# Patient Record
Sex: Female | Born: 1945
Health system: Southern US, Community
[De-identification: ages and names within clinical notes are randomized; demographics above are authoritative.]

## PROBLEM LIST (undated history)

## (undated) DIAGNOSIS — R112 Nausea with vomiting, unspecified: Secondary | ICD-10-CM

## (undated) DIAGNOSIS — E785 Hyperlipidemia, unspecified: Secondary | ICD-10-CM

## (undated) DIAGNOSIS — Z9889 Other specified postprocedural states: Secondary | ICD-10-CM

## (undated) DIAGNOSIS — K589 Irritable bowel syndrome without diarrhea: Secondary | ICD-10-CM

## (undated) DIAGNOSIS — F419 Anxiety disorder, unspecified: Secondary | ICD-10-CM

## (undated) DIAGNOSIS — I10 Essential (primary) hypertension: Secondary | ICD-10-CM

## (undated) DIAGNOSIS — Z780 Asymptomatic menopausal state: Secondary | ICD-10-CM

## (undated) DIAGNOSIS — M1990 Unspecified osteoarthritis, unspecified site: Secondary | ICD-10-CM

## (undated) DIAGNOSIS — F329 Major depressive disorder, single episode, unspecified: Secondary | ICD-10-CM

## (undated) DIAGNOSIS — F32A Depression, unspecified: Secondary | ICD-10-CM

## (undated) HISTORY — PX: COLONOSCOPY: SHX174

## (undated) HISTORY — DX: Hyperlipidemia, unspecified: E78.5

## (undated) HISTORY — DX: Essential (primary) hypertension: I10

## (undated) HISTORY — PX: REPLACEMENT TOTAL KNEE: SUR1224

## (undated) HISTORY — DX: Depression, unspecified: F32.A

## (undated) HISTORY — PX: TONSILLECTOMY: SUR1361

## (undated) HISTORY — DX: Irritable bowel syndrome, unspecified: K58.9

## (undated) HISTORY — PX: BREAST ENHANCEMENT SURGERY: SHX7

## (undated) HISTORY — DX: Unspecified osteoarthritis, unspecified site: M19.90

## (undated) HISTORY — DX: Major depressive disorder, single episode, unspecified: F32.9

## (undated) HISTORY — DX: Asymptomatic menopausal state: Z78.0

## (undated) HISTORY — DX: Anxiety disorder, unspecified: F41.9

---

## 1998-08-27 ENCOUNTER — Other Ambulatory Visit: Admission: RE | Admit: 1998-08-27 | Discharge: 1998-08-27 | Payer: Self-pay | Admitting: Family Medicine

## 1998-12-02 ENCOUNTER — Ambulatory Visit (HOSPITAL_COMMUNITY): Admission: RE | Admit: 1998-12-02 | Discharge: 1998-12-02 | Payer: Self-pay | Admitting: Gastroenterology

## 1999-11-17 ENCOUNTER — Other Ambulatory Visit: Admission: RE | Admit: 1999-11-17 | Discharge: 1999-11-17 | Payer: Self-pay | Admitting: Family Medicine

## 2000-11-29 ENCOUNTER — Other Ambulatory Visit: Admission: RE | Admit: 2000-11-29 | Discharge: 2000-11-29 | Payer: Self-pay | Admitting: Family Medicine

## 2002-01-02 ENCOUNTER — Other Ambulatory Visit: Admission: RE | Admit: 2002-01-02 | Discharge: 2002-01-02 | Payer: Self-pay | Admitting: Family Medicine

## 2002-07-12 ENCOUNTER — Encounter: Payer: Self-pay | Admitting: Emergency Medicine

## 2002-07-12 ENCOUNTER — Emergency Department (HOSPITAL_COMMUNITY): Admission: EM | Admit: 2002-07-12 | Discharge: 2002-07-12 | Payer: Self-pay | Admitting: Emergency Medicine

## 2003-03-08 ENCOUNTER — Other Ambulatory Visit: Admission: RE | Admit: 2003-03-08 | Discharge: 2003-03-08 | Payer: Self-pay | Admitting: Family Medicine

## 2004-05-19 ENCOUNTER — Other Ambulatory Visit: Admission: RE | Admit: 2004-05-19 | Discharge: 2004-05-19 | Payer: Self-pay | Admitting: Family Medicine

## 2005-07-27 ENCOUNTER — Other Ambulatory Visit: Admission: RE | Admit: 2005-07-27 | Discharge: 2005-07-27 | Payer: Self-pay | Admitting: Family Medicine

## 2006-01-24 ENCOUNTER — Inpatient Hospital Stay (HOSPITAL_COMMUNITY): Admission: RE | Admit: 2006-01-24 | Discharge: 2006-01-27 | Payer: Self-pay | Admitting: Orthopedic Surgery

## 2006-09-30 ENCOUNTER — Encounter: Admission: RE | Admit: 2006-09-30 | Discharge: 2006-09-30 | Payer: Self-pay | Admitting: Orthopedic Surgery

## 2007-06-13 ENCOUNTER — Other Ambulatory Visit: Admission: RE | Admit: 2007-06-13 | Discharge: 2007-06-13 | Payer: Self-pay | Admitting: Family Medicine

## 2010-10-13 ENCOUNTER — Encounter: Payer: Self-pay | Admitting: Internal Medicine

## 2010-10-13 ENCOUNTER — Ambulatory Visit (AMBULATORY_SURGERY_CENTER): Payer: 59

## 2010-10-13 VITALS — Ht 65.5 in | Wt 242.5 lb

## 2010-10-13 DIAGNOSIS — Z800 Family history of malignant neoplasm of digestive organs: Secondary | ICD-10-CM

## 2010-10-13 MED ORDER — PEG-KCL-NACL-NASULF-NA ASC-C 100 G PO SOLR: 1.0000 | Freq: Once | ORAL | Status: AC

## 2010-10-23 ENCOUNTER — Other Ambulatory Visit: Payer: Self-pay | Admitting: Internal Medicine

## 2010-10-23 NOTE — Op Note (Signed)
Brenda Reeves, Brenda Reeves            ACCOUNT NO.:  [id number]   MEDICAL RECORD NO.:  [id number]          PATIENT TYPE:  INP   LOCATION:  0005                         FACILITY:  [facility]   PHYSICIAN:  Ollen Gross, M.D.    DATE OF BIRTH:  [birth year]   DATE OF PROCEDURE:  01/24/2006  DATE OF DISCHARGE:                                 OPERATIVE REPORT   PREOPERATIVE DIAGNOSIS:  Osteoarthritis left knee.   POSTOPERATIVE DIAGNOSIS:  Osteoarthritis left knee.   PROCEDURE:  Left total knee arthroplasty.   SURGEON:  Ollen Gross, M.D.   ASSISTANT:  Avel Peace, PA.   ANESTHESIA:  Spinal.   ESTIMATED BLOOD LOSS:  Minimal.   DRAIN:  Hemovac x 1.   TOURNIQUET TIME:  39 minutes.   COMPLICATIONS:  None.   CONDITION:  Stable to recovery.   CLINICAL NOTE:  Brenda Reeves is a 65 year old female with end-stage  osteoarthritis of the left knee with intractable pain.  She presents now for  left total knee arthroplasty.  In addition, she has significant  retrocalcaneal bursitis and Achilles tendinitis of the right heel and I am  going to plan on injecting that today.   PROCEDURE IN DETAIL:  After successful administration of spinal anesthetic,  a tourniquet was placed high on the left thigh and left lower extremity  prepped and draped in the usual sterile fashion.  Extremity had been marked,  knee flexed, tourniquet inflated to 300 mmHg.  Midline incision was made  with a #10 blade through subcutaneous tissue to the level of the extensor  mechanism.  Fresh blade was used to make a medial parapatellar arthrotomy in  the soft tissue of the proximal medial tibia, subperiosteally elevated to  the joint line with a knife into the semimembranosus bursa with a Cobb  elevator.  Soft tissue of the proximal lateral tibia was also elevated with  attention being paid to the avoid the patellar tendon on the tibial  tubercle.  The patella was everted, the knee flexed to 90 degrees, and ACL  and PCL  removed.  Drill was used to create a starting hole in the distal  femur.  The canal was thoroughly irrigated.  A 5-degree left valgus  alignment guide was placed.  The posterior condyle rotation was marked and a  block pin to remove 10 mm off the distal femur.  Distal femoral resection  was made with an oscillating saw.  Cutting blocks placed and size 3 was the  most appropriate.  Rotatoin was marked at the epicondylar axis.  Size 3  cutting block was placed and the anterior, posterior, and chamfer cuts were  made.   Tibia subluxed forward and the menisci removed.  Extramedullary tibial  alignment guide was placed surfacing proximally at the medial aspect of the  tibial tubercle and distally along the second metatarsal axis and tibial  crest.  Blocks pinned to remove approximately 10 mm off the nondeficient  lateral side.  To get down to the base of the defect medially, we had to  take an additional 2.  The size 2.5 tibia was the most appropriate  and then  the proximal tibia was repaired with a modular drill and keyhole punch 4,  size 2.5.  Femoral preparation was completed with the intercondylar cuff  with a size 3.   Size 2.5 mobile bearing tibial trial, size 3 posterior stabilized femoral  trial, and a 12.5-mm posterior stabilized rotating flap from insert trial  placed.  With the 12.5, full extension was achieved with excellent varus and  valgus balance throughout full range of motion.  Patella was everted,  measured to be 22 mm.  Freehand resection was taken to 13 mm.  A 35 template  was placed, low holes drilled, trial patella was placed and it tracks  normally.  Osteophytes were then removed off the posterior femur with the  trials in place.  All trials were removed and the cut bone surfaces prepared  with pulsatile lavage.  Cement was mixed and once with reimplantation, the  size 2.5 mobile bearing tibial tray size 3, posterior stabilized femur and  35 patella cemented into  place, patella was held with a clamp.  Trial 12.5  insert was placed in full extension.  All extrinsic cement removed.  Once  the cement was full hardened, the permanent 12.5-mm posterior stabilized  rotating platform insert was placed into the tibial tray.  The wound was  copiously irrigated with saline solution and the extensor mechanism closed  over a Hemovac drain with interrupted #1 PDS.  Tourniquet was released with  a total time of 39 minutes.  Flexoin against gravity was about 130 degrees.  Subcu was closed with interrupted 2-0 Vicryl, subcuticular running 4-0  Monocryl.  Incision was cleaned and dried and Steri-Strips and a bulky  sterile dressing applied.  Drain was hooked to suction and she was placed  into a knee immobilizer.  I then adequately prepped the right heel.  I  injected the retrocalcaneal bursa with 1 cc of lidocaine and 1 cc of Depo-  Medrol with no problems.  A band-aid was placed.  She was subsequently  awakened and transported to recovery in stable condition.      Ollen Gross, M.D.  Electronically Signed     FA/MEDQ  D:  01/24/2006  T:  01/24/2006  Job:  [id number]

## 2010-10-23 NOTE — H&P (Signed)
Brenda Reeves, Brenda Reeves            ACCOUNT NO.:  [id number]   MEDICAL RECORD NO.:  [id number]          PATIENT TYPE:  INP   LOCATION:  NA                           FACILITY:  [facility]   PHYSICIAN:  Ollen Gross, M.D.    DATE OF BIRTH:  24-Jul-1945   DATE OF ADMISSION:  01/24/2006  DATE OF DISCHARGE:                                HISTORY & PHYSICAL   DATE OF OFFICE VISIT HISTORY AND PHYSICAL:  January 07, 2006.   CHIEF COMPLAINT:  Bilateral knee pain, left greater than right.   HISTORY OF PRESENT ILLNESS:  The patient is a 65 year old female who has  been seen by Dr. Lequita Halt for ongoing bilateral knee pain; the left has been  more symptomatic than the right for some time now.  She has been treated  conservatively in the past including injections, and also  viscosupplementation.  Unfortunately, they have not been helpful.  She is  actually at the stage now where the pain has been interfering with her.  She  would like to have something more permanent done.  She has been seen by Dr.  Lequita Halt.  X-rays show end-stage arthritis in both knees.  She is at a point  now where she would benefit from undergoing knee replacement.  The left is  more symptomatic than the right.  Risks and benefits have been discussed,  and she elects to proceed with surgery.   ALLERGIES:  SULFA causes rash and hives.   CURRENT MEDICATIONS:  1. TOPROL-XL 50 mg.  2. Cymbalta 60 mg  3. __________ XL 300 mg.  4. Lipitor 20 mg.   PAST MEDICAL HISTORY:  1. Depression.  2. Hypercholesterolemia.  3. Hypertension.  4. History of renal calculi.  5. Arthritis.  6. Postmenopausal.   PAST SURGICAL HISTORY:  She has had 1 cesarean section and also breast  implants.   SOCIAL HISTORY:  Married.  Works as a Network engineer.  Nonsmoker.  No  alcohol.  Two children.   FAMILY HISTORY:  Father and brother with history of heart disease.  Mother  with history of liver cancer.   REVIEW OF SYSTEMS:  GENERAL:  No fevers,  chills or night sweats.  NEUROLOGIC:  No seizures, syncope or paralysis.  RESPIRATORY:  No shortness  of breath, productive cough or hemoptysis.  CARDIOVASCULAR:  No chest pain,  angina, orthopnea.  GI:  No nausea, vomiting, diarrhea or constipation.  GU:  History of renal calculi.  No dysuria, hematuria or discharge.  MUSCULOSKELETAL:  Both knees.   PHYSICAL EXAMINATION:  VITAL SIGNS:  Pulse 66, respirations 12, blood  pressure 124/78.  GENERAL:  A 65 year old white female, well-nourished, well-developed, in no  acute distress.  Overweight.  Alert, oriented, cooperative, pleasant.  Excellent historian.  HEENT:  Normocephalic and atraumatic.  Pupils round and reactive.  Does not  wear glasses.  Extraocular movements intact.  NECK:  Supple.  CHEST:  Clear, anterior and posterior chest walls.  HEART:  Regular rate and rhythm without murmur.  S1 and S2 noted.  ABDOMEN:  Soft, nontender.  Bowel sounds present.  RECTAL/BREASTS/GENITALIA:  Not done.  Not pertinent to present illness.  EXTREMITIES:  Left knee shows slight varus malalignment.  Marked crepitus is  noted, 5 to 120 on passive range of motion, tender more medial than lateral.  Right knee shows minimal deformity, range of motion of 5 to 120 degrees on  passive range of motion, marked crepitus, more tender medial than lateral.   IMPRESSION:  1. Osteoarthritis, bilateral knees, left more than symptomatic than right.  2. Depression.  3. Hypercholesterolemia.  4. Hypertension.  5. History of renal calculi.  6. Arthritis.  7. Postmenopausal.   PLAN:  The patient admitted to [facility] to undergo a left total  knee replacement arthroplasty.  The surgery will be performed by Dr. Ollen Gross.      Alexzandrew L. Julien Girt, P.A.      Ollen Gross, M.D.  Electronically Signed    ALP/MEDQ  D:  01/23/2006  T:  01/23/2006  Job:  [id number]   cc:   Otilio Connors. Gerri Spore, M.D.  Fax: [phone]

## 2010-10-23 NOTE — Discharge Summary (Signed)
Brenda Reeves, Brenda Reeves            ACCOUNT NO.:  [id number]   MEDICAL RECORD NO.:  [id number]          PATIENT TYPE:  INP   LOCATION:  1517                         FACILITY:  [facility]   PHYSICIAN:  Ollen Gross, M.D.    DATE OF BIRTH:  07/22/1945   DATE OF ADMISSION:  01/24/2006  DATE OF DISCHARGE:  01/27/2006                                 DISCHARGE SUMMARY   ADMISSION DIAGNOSES:  1. Osteoarthritis bilateral knees, left more symptomatic than right.  2. Depression.  3. Hypercholesterolemia.  4. Hypertension.  5. History of renal calculi.  6. Arthritis.  7. Postmenopausal.   DISCHARGE DIAGNOSES:  1. Osteoarthritis left knee, status post left total knee replacement      arthroplasty.  2. Osteoarthritis right knee.  3. Mild acute blood loss anemia, did not require transfusion.  4. Depression.  5. Hypercholesterolemia.  6. Hypertension.  7. History of renal calculi.  8. Arthritis.  9. Postmenopausal.   PROCEDURE:  January 24, 2006, left total knee.   SURGEON:  Ollen Gross, M.D.   ASSISTANT:  Alexzandrew L. Perkins, P.A.-C.   CONSULTATIONS:  None.   BRIEF HISTORY:  Brenda Reeves is a 65 year old female with osteoarthritis of  both knees, left more problematic than the right, and now presents for total  knee arthroplasty.  She has also had some retrocalcaneal bursitis Achilles  tendinitis in the right heel and planned on injection.   LABORATORY DATA:  Preoperative CBC with hemoglobin 13.7, hematocrit 40.4,  white cell count 7.4.  Postoperative hemoglobin 10.9, drifted down to 10.5,  last known H&H back up to 10.8 and 30.5.  PT and PTT preoperatively 12.4 and  27, respectively.  INR 0.9.  Serial protimes followed.  Last noted PT/INR  34.8 and 3.2.  Chem-panel on admission all within normal limits with the  except of ALP which was slightly elevated to 145.  Serial BMETs were  followed.  Electrolytes remained within normal limits.  Preoperative UA  positive nitrites, small  leukocyte esterase, 7-10 white cells, rare  epithelium, many bacteria, treated preoperatively.  Blood group type A  positive.   Chest two-view January 17, 2006, no evidence of active chest disease.   EKG in 2006, (not able to read the date, looks like April 2006) sinus  tachycardia, otherwise normal.  Confirmed.  Unable to read signature.   HOSPITAL COURSE:  Patient admitted to [facility]  Tolerated the  procedure well.  Later transferred to the recovery room and started on p.o.  and IV analgesics for pain control following surgery.  We in a lot of pain  on the evening of surgery.  Had been placed on PCA.  Had to discontinue this  because of drowsiness.  Encouraged the pills.  Had IV push backup.  Continued with pulse oximeter.  Pulse oximeter ranged from 89 to about 93 on  the morning of day #1.  Encouraged pulmonary toilet.  Hemoglobin was stable  at 10.9, started getting out of bed.  Had a little bit of sats that were  going down a little bit further down than high 80s and low 90s.  Encouraged  incentive spirometer.   By day #2, was doing much better.  Had some discomfort but pain was under  better control.  Had already been up in the chair.  Dressing changed.  Incision looked good. Potassium had dropped a little bit down to 3.5 but  still was within normal limits but we gave her one dose to pump it back up.  Probably likely due to dilutional.   From the therapy standpoint, she started to progress, started getting up and  ambulating approximately 50 feet on day #2 and then again by day #3.  By day  #3, she was doing well, tolerating her medications, eating and drinking okay  and then was ready to go home.   DISCHARGE PLAN:  Patient discharged home on January 27, 2006.   DISCHARGE DIAGNOSES:  Please see above.   DISCHARGE MEDICATIONS:  1. Coumadin.  2. Percocet.  3. Robaxin.   DIET:  Low sodium, low cholesterol diet.   ACTIVITY:  Weightbearing as tolerated left  lower extremity.  Home health PT,  home health nursing, total knee protocol.  PT for gait training and  ambulation.  Home nurse for Coumadin and protimes.   FOLLOW UP:  Two weeks.   DISPOSITION:  Home.   CONDITION ON DISCHARGE:  Improved.      Alexzandrew L. Julien Girt, P.A.      Ollen Gross, M.D.  Electronically Signed    ALP/MEDQ  D:  02/11/2006  T:  02/11/2006  Job:  [id number]   cc:   Otilio Connors. Gerri Spore, M.D.  Fax: [phone]

## 2010-11-18 ENCOUNTER — Other Ambulatory Visit: Payer: Self-pay | Admitting: Internal Medicine

## 2011-01-21 ENCOUNTER — Encounter: Payer: Self-pay | Admitting: Internal Medicine

## 2011-01-21 ENCOUNTER — Ambulatory Visit (INDEPENDENT_AMBULATORY_CARE_PROVIDER_SITE_OTHER): Payer: 59 | Admitting: Internal Medicine

## 2011-01-21 DIAGNOSIS — E669 Obesity, unspecified: Secondary | ICD-10-CM

## 2011-01-21 DIAGNOSIS — I10 Essential (primary) hypertension: Secondary | ICD-10-CM

## 2011-01-21 DIAGNOSIS — F32A Depression, unspecified: Secondary | ICD-10-CM

## 2011-01-21 DIAGNOSIS — E785 Hyperlipidemia, unspecified: Secondary | ICD-10-CM

## 2011-01-21 DIAGNOSIS — K649 Unspecified hemorrhoids: Secondary | ICD-10-CM | POA: Insufficient documentation

## 2011-01-21 DIAGNOSIS — K589 Irritable bowel syndrome without diarrhea: Secondary | ICD-10-CM | POA: Insufficient documentation

## 2011-01-21 DIAGNOSIS — F329 Major depressive disorder, single episode, unspecified: Secondary | ICD-10-CM

## 2011-01-21 DIAGNOSIS — E559 Vitamin D deficiency, unspecified: Secondary | ICD-10-CM | POA: Insufficient documentation

## 2011-01-21 DIAGNOSIS — F419 Anxiety disorder, unspecified: Secondary | ICD-10-CM | POA: Insufficient documentation

## 2011-01-21 DIAGNOSIS — Z780 Asymptomatic menopausal state: Secondary | ICD-10-CM | POA: Insufficient documentation

## 2011-01-21 DIAGNOSIS — M1990 Unspecified osteoarthritis, unspecified site: Secondary | ICD-10-CM | POA: Insufficient documentation

## 2011-01-21 LAB — COMPREHENSIVE METABOLIC PANEL
ALT: 12 U/L (ref 0–35)
AST: 12 U/L (ref 0–37)
Albumin: 4 g/dL (ref 3.5–5.2)
Alkaline Phosphatase: 103 U/L (ref 39–117)
BUN: 17 mg/dL (ref 6–23)
CO2: 22 mEq/L (ref 19–32)
Calcium: 9.3 mg/dL (ref 8.4–10.5)
Chloride: 107 mEq/L (ref 96–112)
Creat: 0.82 mg/dL (ref 0.50–1.10)
Glucose, Bld: 97 mg/dL (ref 70–99)
Potassium: 4.3 mEq/L (ref 3.5–5.3)
Sodium: 139 mEq/L (ref 135–145)
Total Bilirubin: 0.7 mg/dL (ref 0.3–1.2)
Total Protein: 6.9 g/dL (ref 6.0–8.3)

## 2011-01-21 LAB — CBC WITH DIFFERENTIAL/PLATELET
Basophils Absolute: 0 10*3/uL (ref 0.0–0.1)
Basophils Relative: 1 % (ref 0–1)
Eosinophils Absolute: 0.2 10*3/uL (ref 0.0–0.7)
Eosinophils Relative: 2 % (ref 0–5)
HCT: 42.8 % (ref 36.0–46.0)
Hemoglobin: 13.7 g/dL (ref 12.0–15.0)
Lymphocytes Relative: 23 % (ref 12–46)
Lymphs Abs: 1.8 10*3/uL (ref 0.7–4.0)
MCH: 27.7 pg (ref 26.0–34.0)
MCHC: 32 g/dL (ref 30.0–36.0)
MCV: 86.5 fL (ref 78.0–100.0)
Monocytes Absolute: 0.7 10*3/uL (ref 0.1–1.0)
Monocytes Relative: 9 % (ref 3–12)
Neutro Abs: 5.3 10*3/uL (ref 1.7–7.7)
Neutrophils Relative %: 66 % (ref 43–77)
Platelets: 326 10*3/uL (ref 150–400)
RBC: 4.95 MIL/uL (ref 3.87–5.11)
RDW: 13.8 % (ref 11.5–15.5)
WBC: 8 10*3/uL (ref 4.0–10.5)

## 2011-01-21 LAB — LIPID PANEL
Cholesterol: 223 mg/dL — ABNORMAL HIGH (ref 0–200)
HDL: 52 mg/dL (ref >39)
LDL Cholesterol: 153 mg/dL — ABNORMAL HIGH (ref 0–99)
Total CHOL/HDL Ratio: 4.3 Ratio
Triglycerides: 90 mg/dL (ref <150)
VLDL: 18 mg/dL (ref 0–40)

## 2011-01-21 LAB — TSH: TSH: 1.965 u[IU]/mL (ref 0.350–4.500)

## 2011-01-21 NOTE — Progress Notes (Signed)
Subjective:    Patient ID: Brenda Reeves, female    DOB: May 08, 1946, 65 y.o.   MRN: [id number]  HPI  Brenda Reeves is a very pleasant female who is here for her first visit.  Former care Dr. Clyde Canterbury.  PMH of HTN, Hyperlipidemia, chronic depression on Prozac for the last 15 years, DJD, Obesity,  and vitamin D deficiency.  She tells me she is intolerant of statins.  She has been taking Vit D 50,000 units for about 8 weeks now.  She has multiple concerns but primarily states she knows her depression prevents her from doing many things like exercising and eating right.  She has been on the same dose of Prozac for many years, has never seen a therapist,  And has never been hospitalizded for her depression.  She needs her vitamin D and multiple labs checked.    She states she does not like to take Metoprolol  Because she thinks it makes her feel bad.  She still enjoys being around her dog, she is eating excessively,  She is not motivated except for when she is at work, otherwise "I just sit on the couch"  No suicidal or homicidal ideation.  No psychotic features.  She also reports approx. 3 months ago she had vaginal spotting for a few days but has had none since.  She does not have a GYN  Allergies  Allergen Reactions  . Sulfa Antibiotics Itching   Past Medical History  Diagnosis Date  . IBS (irritable colon syndrome)   . Hemorrhoids   . Menopause   . Anxiety   . Arthritis   . Depression     on Prozac for 15 years  . Hyperlipidemia     intolerant to statins  . Hypertension    Past Surgical History  Procedure Date  . Colonoscopy   . Cesarean section   . Breast enhancement surgery   . Replacement total knee     left knee  . Tonsillectomy    History   Social History  . Marital Status: Married    Spouse Name: N/A    Number of Children: 2  . Years of Education: N/A   Occupational History  . [employer]   Social History Main Topics  . Smoking  status: Never Smoker   . Smokeless tobacco: Never Used  . Alcohol Use: No  . Drug Use: No  . Sexually Active: Not Currently   Other Topics Concern  . Not on file   Social History Narrative  . No narrative on file   Family History  Problem Relation Age of Onset  . Colon cancer Mother   . Cancer Mother     liver  . Heart disease Mother     cardiomyopathy  . Heart disease Father     MI   Patient Active Problem List  Diagnoses  . Anxiety  . Arthritis  . IBS (irritable colon syndrome)  . Depression  . Hypertension  . Hyperlipidemia  . Hemorrhoids  . Menopause  . Vitamin D deficiency   Current Outpatient Prescriptions on File Prior to Visit  Medication Sig Dispense Refill  . fish oil-omega-3 fatty acids 1000 MG capsule Take by mouth daily.        Marland Kitchen FLUoxetine (PROZAC) 40 MG capsule Take 40 mg by mouth daily.        . metoprolol tartrate (LOPRESSOR) 25 MG tablet Take 25 mg by mouth daily.        Marland Kitchen  Multiple Vitamins-Minerals (MULTIVITAMIN,TX-MINERALS) tablet Take 1 tablet by mouth daily.                Review of Systems No chest pain, no SOB,  No LE Edema,  No GI symptoms    Objective:   Physical ExamPhysical Exam  Nursing note and vitals reviewed.  Constitutional: She is oriented to person, place, and time. She appears well-developed and well-nourished.  HENT:  Head: Normocephalic and atraumatic.  Cardiovascular: Normal rate and regular rhythm. Exam reveals no gallop and no friction rub.  No murmur heard.  Pulmonary/Chest: Breath sounds normal. She has no wheezes. She has no rales.  Neurological: She is alert and oriented to person, place, and time.  Skin: Skin is warm and dry.  Psychiatric: She has a normal mood and affect. Her behavior is normal.             Assessment & Plan:  1)  Depression:  Will discuss with Dr. Donell Beers regarding adding second agent.  I gave pt number to [facility] therapist and pt states she will call. 2)  Postmenopausal  spotting:  Will refer to Dr. Edward Jolly 3)  HTN:  Recheck next visit but may need to change meds.  Not at goal year.  Check chemistries today.   Free thyroid levels done last year normal 4)  Hyperlipidemia:  Will check labs today.  She reports she is intorlerant of statins 5)  Arthritis 6) Obesity.  I spent 45 minutes with this pt.  She is to schedule CPE

## 2011-01-21 NOTE — Progress Notes (Signed)
Appt scheduled with Dr Wyvonnia Lora 01/28/11 @ 950am.  Pt aware before she left office today.  Referral faxed to Dr. Anastasio Auerbach, rn

## 2011-01-21 NOTE — Patient Instructions (Signed)
Schedule CPE   Keep apppt with GYN Dr., Edward Jolly

## 2011-01-22 LAB — VITAMIN D 25 HYDROXY (VIT D DEFICIENCY, FRACTURES): Vit D, 25-Hydroxy: 30 ng/mL (ref 30–89)

## 2011-01-26 ENCOUNTER — Telehealth: Payer: Self-pay | Admitting: Internal Medicine

## 2011-01-26 ENCOUNTER — Other Ambulatory Visit: Payer: Self-pay | Admitting: Internal Medicine

## 2011-01-26 DIAGNOSIS — F32A Depression, unspecified: Secondary | ICD-10-CM

## 2011-01-26 DIAGNOSIS — F329 Major depressive disorder, single episode, unspecified: Secondary | ICD-10-CM

## 2011-01-26 MED ORDER — BUPROPION HCL ER (XL) 150 MG PO TB24: ORAL_TABLET | ORAL | Status: DC

## 2011-01-26 NOTE — Telephone Encounter (Signed)
Spoke with pt.  Will add Wellbutrin as second agent for depression.  No hx of seizure disorder.  Pt voices understanding .  Keep appt with me

## 2011-01-28 ENCOUNTER — Encounter: Payer: Self-pay | Admitting: Emergency Medicine

## 2011-01-28 NOTE — Telephone Encounter (Signed)
Med sent via eprescribe

## 2011-02-02 ENCOUNTER — Ambulatory Visit: Payer: 59 | Admitting: Internal Medicine

## 2011-02-03 ENCOUNTER — Telehealth: Payer: Self-pay | Admitting: Internal Medicine

## 2011-02-05 ENCOUNTER — Other Ambulatory Visit: Payer: Self-pay | Admitting: Obstetrics and Gynecology

## 2011-02-11 ENCOUNTER — Ambulatory Visit: Payer: 59 | Admitting: Internal Medicine

## 2011-02-12 ENCOUNTER — Encounter (HOSPITAL_COMMUNITY)
Admission: RE | Admit: 2011-02-12 | Discharge: 2011-02-12 | Disposition: A | Discharge status: Home / Self Care | Payer: 59 | Source: Ambulatory Visit | Attending: Obstetrics and Gynecology | Admitting: Obstetrics and Gynecology

## 2011-02-12 ENCOUNTER — Other Ambulatory Visit: Payer: Self-pay

## 2011-02-12 ENCOUNTER — Encounter (HOSPITAL_COMMUNITY): Payer: Self-pay

## 2011-02-12 HISTORY — DX: Other specified postprocedural states: R11.2

## 2011-02-12 HISTORY — DX: Other specified postprocedural states: Z98.890

## 2011-02-12 LAB — CBC
HCT: 39.9 % (ref 36.0–46.0)
Hemoglobin: 12.6 g/dL (ref 12.0–15.0)
MCH: 27.7 pg (ref 26.0–34.0)
MCHC: 31.6 g/dL (ref 30.0–36.0)
MCV: 87.7 fL (ref 78.0–100.0)
Platelets: 283 10*3/uL (ref 150–400)
RBC: 4.55 MIL/uL (ref 3.87–5.11)
RDW: 13.6 % (ref 11.5–15.5)
WBC: 7.5 10*3/uL (ref 4.0–10.5)

## 2011-02-12 NOTE — Patient Instructions (Addendum)
Brenda Reeves  02/12/2011   Your procedure is scheduled on:  02/16/11  Enter through the Main Entrance of [facility] at 930 AM.  Pick up the phone at the desk and dial [phone].   Call this number if you have problems the morning of surgery: [phone]   Remember:   Do not eat food:After Midnight.  Do not drink clear liquids: After Midnight.  Take these medicines the morning of surgery with A SIP OF WATER: Lopressor   Do not wear jewelry, make-up or nail polish.  Do not wear lotions, powders, or perfumes. You may wear deodorant.  Do not shave 48 hours prior to surgery.  Do not bring valuables to the hospital.  Contacts, dentures or bridgework may not be worn into surgery.  Leave suitcase in the car. After surgery it may be brought to your room.  For patients admitted to the hospital, checkout time is 11:00 AM the day of discharge.   Patients discharged the day of surgery will not be allowed to drive home.  Name and phone number of your driver: husband   [name]      Special Instructions: CHG Shower Use Special Wash: 1/2 bottle night before surgery and 1/2 bottle morning of surgery.   Please read over the following fact sheets that you were given: NA

## 2011-02-15 NOTE — H&P (Signed)
Brenda Reeves, BOTTOMS NO.:  [id number]  MEDICAL RECORD NO.:  [id number]  LOCATION:  SDC                           FACILITY:  [facility]  PHYSICIAN:  Randye Lobo, M.D.   DATE OF BIRTH:  1946-01-17  DATE OF ADMISSION:  02/12/2011 DATE OF DISCHARGE:  02/12/2011                             HISTORY & PHYSICAL   CHIEF COMPLAINT:  Vaginal bleeding.  HISTORY OF PRESENT ILLNESS:  The patient is a 65 year old gravida 3, para 2-0-1-2 Caucasian female who presented in August 2012 reporting vaginal bleeding 6 weeks prior.  The patient denies any symptoms of pain.  She underwent menopause in 2002 and has never taken any hormone replacement therapy.  Ultrasound performed in the office on January 28, 2011 documented an endometrial stripe of 20.63 mm with small cystic areas in the endometrium.  Neither ovary was seen.  The patient underwent an endometrial biopsy on January 28, 2011 and this documented atrophic appearing endometrium with no evidence of hyperplasia or malignancy.  PAST OBSTETRIC AND GYNECOLOGIC HISTORIES:  Status post cesarean section x1.  Status post vaginal delivery x1.  Status post spontaneous miscarriage x1.  The patient is status post bilateral tubal ligation with reversal.  The patient's last Pap smear was performed January 28, 2011 and was within normal limits.  Her last mammogram was performed in 2011 and was within normal limits.  PAST MEDICAL HISTORY: 1. History of irritable bowel syndrome. 2. History of urinary tract infections.  The patient is status post     treatment for urinary tract infection on January 28, 2011.  A     followup urine culture on February 04, 2011 was negative. 3. Hypertension 4. Depression.  PAST SURGICAL HISTORY: 1. Status post cesarean section x1. 2. Status post bilateral breast implants. 3. Status post bilateral tubal ligation. 4. Status post bilateral tubal ligation reversal. 5. Status post knee replacement.  MEDICATIONS:   Prozac, metoprolol, Macrobid.  ALLERGIES:  SULFA.  SOCIAL HISTORY:  The patient has been married for 29 years.  She has 2 children.  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  She denies the use of tobacco, alcohol, or illicit drugs.  FAMILY HISTORY:  Positive for coronary artery disease in the patient's mother and father.  Positive for colon cancer in the patient's mother. There is no family history of breast, uterine, or ovarian cancer.  REVIEW OF SYSTEMS:  Please refer to the history of present illness.  PHYSICAL EXAMINATION:  VITAL SIGNS:  Height 5 feet 5 inches, weight 246 pounds, blood pressure 130/76. LUNGS:  Clear to auscultation bilaterally. HEART:  S1, S2 with regular rate and rhythm. ABDOMEN:  There is evidence of an umbilical hernia which is nontender. ABDOMEN:  Soft and nontender and without evidence of hepatosplenomegaly or organomegaly. PELVIC:  Normal external genitalia and urethra.  The cervix and vagina demonstrate no lesions.  The uterus is small and nontender.  There is no evidence of any adnexal masses nor tenderness.  IMPRESSION:  The patient is a 65 year old para 2 female with postmenopausal bleeding and endometrial thickening.  Office endometrial biopsy does not explain the findings of the endometrial thickening noted on ultrasound.  PLAN:  The patient will undergo a hysteroscopy with dilation and curettage at the [facility] on February 16, 2011. Risks, benefits, and alternatives have been reviewed with the patient who wishes to proceed.     Randye Lobo, M.D.     BES/MEDQ  D:  02/15/2011  T:  02/15/2011  Job:  [id number]

## 2011-02-16 ENCOUNTER — Encounter (HOSPITAL_COMMUNITY)
Admission: RE | Disposition: A | Discharge status: Home / Self Care | Payer: Self-pay | Source: Ambulatory Visit | Attending: Obstetrics and Gynecology

## 2011-02-16 ENCOUNTER — Ambulatory Visit (HOSPITAL_COMMUNITY)
Admission: RE | Admit: 2011-02-16 | Discharge: 2011-02-16 | Disposition: A | Discharge status: Home / Self Care | Payer: 59 | Source: Ambulatory Visit | Attending: Obstetrics and Gynecology | Admitting: Obstetrics and Gynecology

## 2011-02-16 ENCOUNTER — Encounter (HOSPITAL_COMMUNITY): Payer: Self-pay | Admitting: Anesthesiology

## 2011-02-16 ENCOUNTER — Other Ambulatory Visit: Payer: Self-pay | Admitting: Obstetrics and Gynecology

## 2011-02-16 ENCOUNTER — Ambulatory Visit (HOSPITAL_COMMUNITY): Payer: 59 | Admitting: Anesthesiology

## 2011-02-16 DIAGNOSIS — Z01812 Encounter for preprocedural laboratory examination: Secondary | ICD-10-CM | POA: Insufficient documentation

## 2011-02-16 DIAGNOSIS — N840 Polyp of corpus uteri: Secondary | ICD-10-CM | POA: Insufficient documentation

## 2011-02-16 DIAGNOSIS — N950 Postmenopausal bleeding: Secondary | ICD-10-CM | POA: Insufficient documentation

## 2011-02-16 DIAGNOSIS — Z01818 Encounter for other preprocedural examination: Secondary | ICD-10-CM | POA: Insufficient documentation

## 2011-02-16 SURGERY — DILATATION & CURETTAGE/HYSTEROSCOPY WITH RESECTOCOPE
Anesthesia: Monitor Anesthesia Care | Site: Vagina | Wound class: Clean Contaminated

## 2011-02-16 MED ORDER — PHENYLEPHRINE 40 MCG/ML (10ML) SYRINGE FOR IV PUSH (FOR BLOOD PRESSURE SUPPORT)
PREFILLED_SYRINGE | INTRAVENOUS | Status: AC
  Filled 2011-02-16: qty 5

## 2011-02-16 MED ORDER — IBUPROFEN 200 MG PO TABS: 600.0000 mg | ORAL_TABLET | Freq: Four times a day (QID) | ORAL | Status: AC | PRN

## 2011-02-16 MED ORDER — PROPOFOL 10 MG/ML IV EMUL
INTRAVENOUS | Status: AC
  Filled 2011-02-16: qty 20

## 2011-02-16 MED ORDER — PROPOFOL 10 MG/ML IV EMUL
INTRAVENOUS | Status: DC | PRN
  Administered 2011-02-16 (×3): 20 mg via INTRAVENOUS
  Administered 2011-02-16: 10 mg via INTRAVENOUS
  Administered 2011-02-16 (×3): 20 mg via INTRAVENOUS

## 2011-02-16 MED ORDER — CEFAZOLIN SODIUM 1-5 GM-% IV SOLN
INTRAVENOUS | Status: AC
  Filled 2011-02-16: qty 50

## 2011-02-16 MED ORDER — LIDOCAINE IN DEXTROSE 5-7.5 % IV SOLN
INTRAVENOUS | Status: AC
  Filled 2011-02-16: qty 2

## 2011-02-16 MED ORDER — LIDOCAINE HCL (CARDIAC) 20 MG/ML IV SOLN
INTRAVENOUS | Status: AC
  Filled 2011-02-16: qty 5

## 2011-02-16 MED ORDER — GLYCINE 1.5 % IR SOLN
Status: DC | PRN
  Administered 2011-02-16: 3000 mL

## 2011-02-16 MED ORDER — LACTATED RINGERS IV SOLN
INTRAVENOUS | Status: DC
  Administered 2011-02-16 (×2): via INTRAVENOUS

## 2011-02-16 MED ORDER — MIDAZOLAM HCL 5 MG/5ML IJ SOLN
INTRAMUSCULAR | Status: DC | PRN
  Administered 2011-02-16: 2 mg via INTRAVENOUS

## 2011-02-16 MED ORDER — GLYCOPYRROLATE 0.2 MG/ML IJ SOLN
INTRAMUSCULAR | Status: AC
  Filled 2011-02-16: qty 1

## 2011-02-16 MED ORDER — LIDOCAINE IN DEXTROSE 5-7.5 % IV SOLN
INTRAVENOUS | Status: DC | PRN
  Administered 2011-02-16: 50 mg via INTRATHECAL

## 2011-02-16 MED ORDER — PHENYLEPHRINE HCL 10 MG/ML IJ SOLN
INTRAMUSCULAR | Status: DC | PRN
  Administered 2011-02-16: 40 ug via INTRAVENOUS
  Administered 2011-02-16: 80 ug via INTRAVENOUS
  Administered 2011-02-16: 40 ug via INTRAVENOUS

## 2011-02-16 MED ORDER — CEFAZOLIN SODIUM 1-5 GM-% IV SOLN
1.0000 g | Freq: Once | INTRAVENOUS | Status: AC
  Administered 2011-02-16: 1 g via INTRAVENOUS

## 2011-02-16 MED ORDER — ONDANSETRON HCL 4 MG/2ML IJ SOLN
INTRAMUSCULAR | Status: DC | PRN
  Administered 2011-02-16: 4 mg via INTRAVENOUS

## 2011-02-16 MED ORDER — FENTANYL CITRATE 0.05 MG/ML IJ SOLN: 25.0000 ug | INTRAMUSCULAR | Status: DC | PRN

## 2011-02-16 MED ORDER — MIDAZOLAM HCL 2 MG/2ML IJ SOLN
INTRAMUSCULAR | Status: AC
  Filled 2011-02-16: qty 2

## 2011-02-16 MED ORDER — FENTANYL CITRATE 0.05 MG/ML IJ SOLN
INTRAMUSCULAR | Status: AC
  Filled 2011-02-16: qty 2

## 2011-02-16 MED ORDER — LIDOCAINE HCL 1 % IJ SOLN
INTRAMUSCULAR | Status: DC | PRN
  Administered 2011-02-16: 10 mL

## 2011-02-16 SURGICAL SUPPLY — 10 items
CANISTER SUCTION 2500CC (MISCELLANEOUS) ×3 IMPLANT
CATH ROBINSON RED A/P 16FR (CATHETERS) ×3 IMPLANT
CLOTH BEACON ORANGE TIMEOUT ST (SAFETY) ×3 IMPLANT
CONTAINER PREFILL 10% NBF 60ML (FORM) ×6 IMPLANT
DRAPE UTILITY XL STRL (DRAPES) ×3 IMPLANT
GLOVE BIO SURGEON STRL SZ 6.5 (GLOVE) ×6 IMPLANT
GOWN PREVENTION PLUS LG XLONG (DISPOSABLE) ×3 IMPLANT
PACK HYSTEROSCOPY LF (CUSTOM PROCEDURE TRAY) ×3 IMPLANT
TOWEL OR 17X24 6PK STRL BLUE (TOWEL DISPOSABLE) ×6 IMPLANT
WATER STERILE IRR 1000ML POUR (IV SOLUTION) ×3 IMPLANT

## 2011-02-16 NOTE — Progress Notes (Signed)
Pre-op Note  No marked change in status since office pre-op visit. 

## 2011-02-16 NOTE — Transfer of Care (Signed)
Immediate Anesthesia Transfer of Care Note  Patient: Brenda Reeves  Procedure(s) Performed:  DILATATION & CURETTAGE/HYSTEROSCOPY WITH RESECTOSCOPE  Patient Location: PACU  Anesthesia Type: Spinal  Level of Consciousness: awake, alert  and oriented  Airway & Oxygen Therapy: Patient Spontanous Breathing and Patient connected to nasal cannula oxygen  Post-op Assessment: Report given to PACU RN and Post -op Vital signs reviewed and stable  Post vital signs: Reviewed and stable  Complications: No apparent anesthesia complications

## 2011-02-16 NOTE — Anesthesia Postprocedure Evaluation (Signed)
Anesthesia Post Note  Patient: Brenda Reeves  Procedure(s) Performed:  DILATATION & CURETTAGE/HYSTEROSCOPY WITH RESECTOSCOPE  Anesthesia type: GA  Patient location: PACU  Post pain: Pain level controlled  Post assessment: Post-op Vital signs reviewed  Last Vitals:  Filed Vitals:   02/16/11 1140  BP: 110/64  Pulse: 73  Temp: 97.4 F (36.3 C)  Resp: 20    Post vital signs: Reviewed  Level of consciousness: sedated  Complications: No apparent anesthesia complications

## 2011-02-16 NOTE — Brief Op Note (Signed)
02/16/2011  11:42 AM  PATIENT:  Brenda Reeves  65 y.o. female  PRE-OPERATIVE DIAGNOSIS:  Postmenopausal bleeding Thickened endometrium  POST-OPERATIVE DIAGNOSIS:  Postmenopausal bleeding, Endometrial polyp  PROCEDURE:  Procedure(s): DILATATION & CURETTAGE/HYSTEROSCOPY WITH RESECTOSCOPE, ENDOMETRIAL POLYPECTOMY  SURGEON:  Surgeon(s): Brook A Silva  PHYSICIAN ASSISTANT:   ASSISTANTS: none   ANESTHESIA:   spinal  OR FLUID I/O:  Total I/O In: 1300 [I.V.:1300] Out: 90 [Urine:75; Blood:15]  BLOOD ADMINISTERED:none  DRAINS: none   LOCAL MEDICATIONS USED:  LIDOCAINE 10 CC  SPECIMEN:  Source of Specimen:  Endometrial polyp and endometrial curettings  DISPOSITION OF SPECIMEN:  PATHOLOGY  COUNTS:  YES  TOURNIQUET:  * No tourniquets in log *  DICTATION: .Other Dictation: Dictation Number   PLAN OF CARE: Discharge to home after PACU  PATIENT DISPOSITION:  PACU - hemodynamically stable.   Delay start of Pharmacological VTE agent (>24hrs) due to surgical blood loss or risk of bleeding:  not applicable

## 2011-02-16 NOTE — Anesthesia Preprocedure Evaluation (Addendum)
Anesthesia Evaluation  Name, MR# and DOB Patient awake  General Assessment Comment  Reviewed: Allergy & Precautions, H&P , NPO status , Patient's Chart, lab work & pertinent test results, reviewed documented beta blocker date and time   History of Anesthesia Complications (+) PONV and PROLONGED INTUBATION  Airway Mallampati: III TM Distance: <3 FB Neck ROM: full    Dental  (+) Teeth Intact   Pulmonary  sleep apnea (told she has sleep apnea - refuses CPAP mask)  clear to auscultation  breath sounds clear to auscultation none    Cardiovascular hypertension, regular Normal    Neuro/Psych    (+) PSYCHIATRIC DISORDERS (depression),  Negative Neurological ROS    GI/Hepatic/Renal negative GI ROS  negative Liver ROS  negative Renal ROS        Endo/Other  (+)   Morbid obesity  Abdominal   Musculoskeletal  (+) Arthritis - (bilateral knees), Osteoarthritis,    Hematology negative hematology ROS (+)   Peds  Reproductive/Obstetrics negative OB ROS    Anesthesia Other Findings            Anesthesia Physical Anesthesia Plan  ASA: III  Anesthesia Plan: Spinal and MAC   Post-op Pain Management:    Induction:   Airway Management Planned:   Additional Equipment:   Intra-op Plan:   Post-operative Plan:   Informed Consent: I have reviewed the patients History and Physical, chart, labs and discussed the procedure including the risks, benefits and alternatives for the proposed anesthesia with the patient or authorized representative who has indicated his/her understanding and acceptance.   Dental Advisory Given  Plan Discussed with: CRNA and Surgeon  Anesthesia Plan Comments:         Anesthesia Quick Evaluation

## 2011-02-16 NOTE — Op Note (Signed)
Brenda Reeves, BALAN NO.:  [id number]  MEDICAL RECORD NO.:  [id number]  LOCATION:  [facility]                          FACILITY:  [facility]  PHYSICIAN:  Randye Lobo, M.D.   DATE OF BIRTH:  12-24-1945  DATE OF PROCEDURE:  02/16/2011 DATE OF DISCHARGE:  02/16/2011                              OPERATIVE REPORT   PREOPERATIVE DIAGNOSIS:  Postmenopausal bleeding.  POSTOPERATIVE DIAGNOSES:  1..  Postmenopausal bleeding. 1. Endometrial polyp.  PROCEDURE:  Hysteroscopy, dilation and curettage, hysteroscopic polypectomy.  SURGEON:  Randye Lobo, MD  ANESTHESIA:  Spinal, IV sedation, paracervical block with 1% lidocaine.  ESTIMATED BLOOD LOSS:  Minimal.  URINE OUTPUT:  75 mL by I and O catheterization prior to procedure, 1.5% glycine deficit 135 mL.  COMPLICATIONS:  None.  INDICATIONS FOR PROCEDURE:  The patient is a 65 year old gravida 3, para 2-0-1-2 Caucasian female who presented with postmenopausal bleeding. Office ultrasound documented an endometrial thickness of 20.63 mm with cystic areas.  An endometrial biopsy documented atrophic endometrium with no evidence of hyperplasia or malignancy.  As the office endometrial biopsy result did not match the ultrasound findings, a recommendation was made to proceed with a hysteroscopy with dilation and curettage and possible polypectomy after risks, benefits, and alternatives were reviewed.  FINDINGS:  Exam under anesthesia revealed a small, slightly anteverted, mobile uterus.  No adnexal masses were palpable.  The cervix demonstrated no lesions.  Hysteroscopy demonstrated a long and thin polyp which measured approximately 2 cm in length and 1 cm in diameter.  It was attached to the anterior and right fundal portion of the uterus.  The right tubal ostial region could not be identified after the polyp removal and the left tubal ostial region appeared to be closed.  There were no lesions of the  endocervix.  SPECIMENS:  The endometrial polyp was sent separately from endometrial curettings to the Pathology department.  PROCEDURE DESCRIPTION:  The patient was reidentified in the preoperative hold area.  She received Ancef 1 g IV for antibiotic prophylaxis.  She received TED hose for DVT prophylaxis.  The patient received a spinal anesthetic and was then placed in the dorsal lithotomy position.  The vagina and perineum were sterilely prepped and the patient was catheterized of urine.  She was sterilely draped.  An exam under anesthesia was performed.  A speculum was placed inside the vagina and a tenaculum was placed on the anterior cervical lip.  A paracervical block was performed in standard fashion with a total of 10 mL of 1% lidocaine.  The uterus was sounded to 8 cm.  The cervix was then dilated with a #21 Pratt dilator.  The diagnostic hysteroscope was then inserted into the uterine cavity and to the continuous infusion of 1.5% glycine solution.  The findings are as noted above.  The hysteroscope was removed and the cervix was then dilated to a #31 Pratt dilator.  The resectoscope was inserted again under the infusion of glycine solution.  The base of the polyp was partially resected with the resectoscope using a cutting setting of monopolar cautery.  The resectoscope was then removed and the polyp was extracted using a polyp forceps.  Final fragments of  the polyp were then removed completely using the resectoscope.  The specimen was sent to pathology.  A sharp and a serrated curette were used to curette the remaining endometrium which appeared atrophic.  The specimen was sent to pathology.  The instruments were removed from the vagina after hemostasis was ensured.  This concluded the patient's procedure.  All needle, instrument, and sponge counts were correct.  There were no complications.  The patient was escorted to the recovery room in stable and awake  condition.     Randye Lobo, M.D.     BES/MEDQ  D:  02/16/2011  T:  02/16/2011  Job:  [id number]

## 2011-02-16 NOTE — Anesthesia Procedure Notes (Signed)
Spinal Block  Patient location during procedure: OR Start time: 02/16/2011 10:55 AM Reason for block: procedure for pain Staffing Performed by: anesthesiologist  Preanesthetic Checklist Completed: patient identified, site marked, surgical consent, pre-op evaluation, timeout performed, IV checked, risks and benefits discussed and monitors and equipment checked Spinal Block Patient position: sitting Prep: site prepped and draped and DuraPrep Patient monitoring: heart rate, cardiac monitor, continuous pulse ox and blood pressure Approach: midline Location: L3-4 Injection technique: single-shot Needle Needle type: Pencan  Needle gauge: 24 G Needle length: 9 cm Assessment Sensory level: T8

## 2011-02-16 NOTE — Preoperative (Signed)
Beta Blockers   Reason not to administer Beta Blockers:Not Applicable 

## 2011-02-17 ENCOUNTER — Telehealth: Payer: Self-pay | Admitting: Internal Medicine

## 2011-02-17 DIAGNOSIS — I10 Essential (primary) hypertension: Secondary | ICD-10-CM

## 2011-02-17 MED ORDER — METOPROLOL SUCCINATE ER 50 MG PO TB24: 50.0000 mg | ORAL_TABLET | Freq: Every day | ORAL | Status: DC

## 2011-02-17 NOTE — Telephone Encounter (Signed)
Pt request refill for blood pressure medicine Metoprolol 25 mg tablets.  Pharmacy [facility] on [facility] phone number [phone].  Call back number for pt [phone].

## 2011-02-17 NOTE — Telephone Encounter (Signed)
Spoke with [employer].  She would like to know if it would be okay to take Metoprolol 50mg  once daily instead of 25mg  BID?  Would like 90 day supply called to [facility] outpatient pharmacy.  Also, wanted to let you know she had D&C done yesterday with Dr. Edward Jolly, she is doing well.  Will make f/u appointment once results are available from procedure

## 2011-02-18 NOTE — Telephone Encounter (Signed)
Pt aware.

## 2011-02-23 ENCOUNTER — Encounter: Payer: Self-pay | Admitting: Emergency Medicine

## 2011-08-18 ENCOUNTER — Other Ambulatory Visit: Payer: Self-pay

## 2011-08-19 MED ORDER — FLUOXETINE HCL 40 MG PO CAPS: 40.0000 mg | ORAL_CAPSULE | Freq: Every day | ORAL | Status: DC

## 2011-10-01 ENCOUNTER — Other Ambulatory Visit: Payer: Self-pay | Admitting: Internal Medicine

## 2011-10-03 NOTE — Telephone Encounter (Signed)
Brenda Reeves,  Please call this pt and let her know that I refilled her Wellbutrin for 3 months but she needs an office visit with me.  I have only seen her once back in 01/2011.  Give her an appointment time and message back to me with the date  thanks

## 2011-10-04 ENCOUNTER — Other Ambulatory Visit: Payer: Self-pay | Admitting: *Deleted

## 2011-10-04 MED ORDER — FLUOXETINE HCL 40 MG PO CAPS: 40.0000 mg | ORAL_CAPSULE | Freq: Every day | ORAL | Status: DC

## 2011-10-04 NOTE — Telephone Encounter (Signed)
Brenda Reeves called back, states wants to know how much this appointment will cost because last one cost $226, informed her I do not deal with billing, but since last one was as a new patient this may be less, referred her to ob/gyn office staff to answer her question.  Patient did agree to schedule appointment with the understanding she can cancel.

## 2011-10-04 NOTE — Telephone Encounter (Signed)
Called patient home and cell number and left message we are calling back with some information from your doctor, and need to schedule appt., please call office

## 2011-10-04 NOTE — Telephone Encounter (Signed)
Received fax requesting review and approve refill or deny- last filled 08/19/11

## 2011-10-12 ENCOUNTER — Other Ambulatory Visit: Payer: Self-pay | Admitting: *Deleted

## 2011-10-12 MED ORDER — FLUOXETINE HCL 40 MG PO CAPS: 40.0000 mg | ORAL_CAPSULE | Freq: Every day | ORAL | Status: DC

## 2011-10-12 NOTE — Telephone Encounter (Addendum)
Received fax requesting review, indicate approved or denied then sign and fax back, last filled 08/19/11, rx written 08/19/11-Called pharmacy to clarify- patient requesting 90 day supply- original rx was 1 month supply

## 2011-10-21 ENCOUNTER — Encounter: Payer: 59 | Admitting: Internal Medicine

## 2011-10-21 MED ORDER — FLUOXETINE HCL 40 MG PO CAPS: 40.0000 mg | ORAL_CAPSULE | Freq: Every day | ORAL | Status: DC

## 2011-10-25 NOTE — Progress Notes (Signed)
  Subjective:    Patient ID: Brenda Reeves, female    DOB: [birth year], 66 y.o.   MRN: [id number]  HPI  Erroneous   Review of Systems     Objective:   Physical Exam        Assessment & Plan:

## 2011-11-29 ENCOUNTER — Ambulatory Visit: Payer: 59 | Admitting: Internal Medicine

## 2011-12-16 ENCOUNTER — Ambulatory Visit: Payer: 59 | Admitting: Internal Medicine

## 2011-12-28 ENCOUNTER — Ambulatory Visit (INDEPENDENT_AMBULATORY_CARE_PROVIDER_SITE_OTHER): Payer: 59 | Admitting: Internal Medicine

## 2011-12-28 ENCOUNTER — Encounter: Payer: Self-pay | Admitting: Internal Medicine

## 2011-12-28 VITALS — BP 134/82 | HR 80 | Temp 97.0°F | Resp 16 | Ht 66.0 in | Wt 250.0 lb

## 2011-12-28 DIAGNOSIS — F3289 Other specified depressive episodes: Secondary | ICD-10-CM

## 2011-12-28 DIAGNOSIS — I10 Essential (primary) hypertension: Secondary | ICD-10-CM

## 2011-12-28 DIAGNOSIS — F329 Major depressive disorder, single episode, unspecified: Secondary | ICD-10-CM

## 2011-12-28 DIAGNOSIS — E669 Obesity, unspecified: Secondary | ICD-10-CM

## 2011-12-28 DIAGNOSIS — E785 Hyperlipidemia, unspecified: Secondary | ICD-10-CM

## 2011-12-28 DIAGNOSIS — F32A Depression, unspecified: Secondary | ICD-10-CM

## 2011-12-28 MED ORDER — FLUOXETINE HCL 40 MG PO CAPS: 40.0000 mg | ORAL_CAPSULE | Freq: Every day | ORAL | Status: DC

## 2011-12-28 NOTE — Patient Instructions (Addendum)
Keep appt with me for physical

## 2011-12-28 NOTE — Progress Notes (Signed)
Subjective:    Patient ID: Brenda Reeves, female    DOB: January 23, 1946, 66 y.o.   MRN: [id number]  HPI Brenda Reeves is here for follow up.  Overall doing well but reports that she stopped taking her WEllbutrin as she felt it was not helping.    Stressful at work  "things are just not the same" but she will go on SS later this year.  No S/H ideation, some depression but less so   Would like to try something non RX tohelp her sleep  She is going to change her breakfast to veggie smoothies to try to cut down on weight  Allergies  Allergen Reactions  . Sulfa Antibiotics Itching   Past Medical History  Diagnosis Date  . IBS (irritable colon syndrome)   . Hemorrhoids   . Menopause   . Anxiety   . Arthritis   . Depression     on Prozac for 15 years  . Hyperlipidemia     intolerant to statins  . Hypertension   . PONV (postoperative nausea and vomiting)    Past Surgical History  Procedure Date  . Colonoscopy   . Cesarean section   . Breast enhancement surgery   . Replacement total knee     left knee  . Tonsillectomy    History   Social History  . Marital Status: Married    Spouse Name: N/A    Number of Children: 2  . Years of Education: N/A   Occupational History  . [employer] Manager [employer]   Social History Main Topics  . Smoking status: Never Smoker   . Smokeless tobacco: Never Used  . Alcohol Use: No  . Drug Use: No  . Sexually Active: Not Currently   Other Topics Concern  . Not on file   Social History Narrative  . No narrative on file   Family History  Problem Relation Age of Onset  . Colon cancer Mother   . Cancer Mother     liver  . Heart disease Mother     cardiomyopathy  . Heart disease Father     MI   Patient Active Problem List  Diagnosis  . Anxiety  . Arthritis  . IBS (irritable colon syndrome)  . Depression  . Hypertension  . Hyperlipidemia  . Hemorrhoids  . Menopause  . Vitamin D deficiency  . Obesity    Current Outpatient Prescriptions on File Prior to Visit  Medication Sig Dispense Refill  . ergocalciferol (VITAMIN D2) 50000 UNITS capsule Take 50,000 Units by mouth once a week.        . fish oil-omega-3 fatty acids 1000 MG capsule Take by mouth daily.        . metoprolol (TOPROL XL) 50 MG 24 hr tablet Take 1 tablet (50 mg total) by mouth daily.  90 tablet  3  . metoprolol tartrate (LOPRESSOR) 25 MG tablet Take 25 mg by mouth 2 (two) times daily.       . Multiple Vitamins-Minerals (MULTIVITAMINS THER. W/MINERALS) TABS Take 1 tablet by mouth daily.        Marland Kitchen DISCONTD: FLUoxetine (PROZAC) 40 MG capsule Take 1 capsule (40 mg total) by mouth daily.  90 capsule  1       Review of Systems See HPI    Objective:   Physical Exam  Physical Exam  Nursing note and vitals reviewed.  Constitutional: She is oriented to person, place, and time. She appears well-developed and well-nourished.  HENT:  Head: Normocephalic and atraumatic.  Cardiovascular: Normal rate and regular rhythm. Exam reveals no gallop and no friction rub.  No murmur heard.  Pulmonary/Chest: Breath sounds normal. She has no wheezes. She has no rales.  Neurological: She is alert and oriented to person, place, and time.  Skin: Skin is warm and dry.  Psychiatric: She has a normal mood and affect. Her behavior is normal.             Assessment & Plan:  HTN  Well controlled   Stay on Toprol for now  Hyperlipidemia  Will check at CPE  Depression  Continue Prozac  Keep appt for CPE with me

## 2011-12-29 MED ORDER — METOPROLOL SUCCINATE ER 50 MG PO TB24: 50.0000 mg | ORAL_TABLET | Freq: Every day | ORAL | Status: DC

## 2011-12-29 NOTE — Addendum Note (Signed)
Addended by: Raechel Chute D on: 12/29/2011 01:17 PM   Modules accepted: Orders

## 2012-04-17 ENCOUNTER — Encounter: Payer: 59 | Admitting: Internal Medicine

## 2012-06-29 ENCOUNTER — Ambulatory Visit (INDEPENDENT_AMBULATORY_CARE_PROVIDER_SITE_OTHER): Payer: 59 | Admitting: Internal Medicine

## 2012-06-29 ENCOUNTER — Encounter: Payer: Self-pay | Admitting: Internal Medicine

## 2012-06-29 VITALS — BP 137/78 | HR 80 | Temp 97.8°F | Resp 18 | Ht 66.0 in | Wt 245.0 lb

## 2012-06-29 DIAGNOSIS — R197 Diarrhea, unspecified: Secondary | ICD-10-CM

## 2012-06-29 DIAGNOSIS — K529 Noninfective gastroenteritis and colitis, unspecified: Secondary | ICD-10-CM

## 2012-06-29 DIAGNOSIS — K5289 Other specified noninfective gastroenteritis and colitis: Secondary | ICD-10-CM

## 2012-06-29 DIAGNOSIS — R110 Nausea: Secondary | ICD-10-CM

## 2012-06-29 LAB — COMPREHENSIVE METABOLIC PANEL
ALT: 11 U/L (ref 0–35)
AST: 13 U/L (ref 0–37)
Albumin: 4.2 g/dL (ref 3.5–5.2)
Alkaline Phosphatase: 86 U/L (ref 39–117)
BUN: 15 mg/dL (ref 6–23)
CO2: 25 mEq/L (ref 19–32)
Calcium: 9.3 mg/dL (ref 8.4–10.5)
Chloride: 106 mEq/L (ref 96–112)
Creat: 0.85 mg/dL (ref 0.50–1.10)
Glucose, Bld: 115 mg/dL — ABNORMAL HIGH (ref 70–99)
Potassium: 4.3 mEq/L (ref 3.5–5.3)
Sodium: 139 mEq/L (ref 135–145)
Total Bilirubin: 0.7 mg/dL (ref 0.3–1.2)
Total Protein: 6.7 g/dL (ref 6.0–8.3)

## 2012-06-29 LAB — CBC WITH DIFFERENTIAL/PLATELET
Basophils Absolute: 0 10*3/uL (ref 0.0–0.1)
Basophils Relative: 0 % (ref 0–1)
Eosinophils Absolute: 0.3 10*3/uL (ref 0.0–0.7)
Eosinophils Relative: 4 % (ref 0–5)
HCT: 41.1 % (ref 36.0–46.0)
Hemoglobin: 13.8 g/dL (ref 12.0–15.0)
Lymphocytes Relative: 20 % (ref 12–46)
Lymphs Abs: 1.8 10*3/uL (ref 0.7–4.0)
MCH: 27.9 pg (ref 26.0–34.0)
MCHC: 33.6 g/dL (ref 30.0–36.0)
MCV: 83.2 fL (ref 78.0–100.0)
Monocytes Absolute: 0.8 10*3/uL (ref 0.1–1.0)
Monocytes Relative: 8 % (ref 3–12)
Neutro Abs: 6.3 10*3/uL (ref 1.7–7.7)
Neutrophils Relative %: 68 % (ref 43–77)
Platelets: 337 10*3/uL (ref 150–400)
RBC: 4.94 MIL/uL (ref 3.87–5.11)
RDW: 13.8 % (ref 11.5–15.5)
WBC: 9.2 10*3/uL (ref 4.0–10.5)

## 2012-06-29 NOTE — Patient Instructions (Signed)
See me if not better 

## 2012-06-29 NOTE — Progress Notes (Signed)
Subjective:    Patient ID: Brenda Reeves, female    DOB: 1946-01-12, 67 y.o.   MRN: [id number]  HPI Brenda Reeves is here for acute visit.  Last 3 days had had nausea and diarrhea illness. No documented fever  Cramping abd pain in both lower quadrants that is relieved by diarrhea. No upper epigastric pain no pain radiating through to back    Nausea has stopped.   Slept through the night last night and no BM this am.    She reports she is feeling much better.    Allergies  Allergen Reactions  . Sulfa Antibiotics Itching   Past Medical History  Diagnosis Date  . IBS (irritable colon syndrome)   . Hemorrhoids   . Menopause   . Anxiety   . Arthritis   . Depression     on Prozac for 15 years  . Hyperlipidemia     intolerant to statins  . Hypertension   . PONV (postoperative nausea and vomiting)    Past Surgical History  Procedure Date  . Colonoscopy   . Cesarean section   . Breast enhancement surgery   . Replacement total knee     left knee  . Tonsillectomy    History   Social History  . Marital Status: Married    Spouse Name: N/A    Number of Children: 2  . Years of Education: N/A   Occupational History  . [employer] Manager [employer]   Social History Main Topics  . Smoking status: Never Smoker   . Smokeless tobacco: Never Used  . Alcohol Use: No  . Drug Use: No  . Sexually Active: Not Currently   Other Topics Concern  . Not on file   Social History Narrative  . No narrative on file   Family History  Problem Relation Age of Onset  . Colon cancer Mother   . Cancer Mother     liver  . Heart disease Mother     cardiomyopathy  . Heart disease Father     MI   Patient Active Problem List  Diagnosis  . Anxiety  . Arthritis  . IBS (irritable colon syndrome)  . Depression  . Hypertension  . Hyperlipidemia  . Hemorrhoids  . Menopause  . Vitamin D deficiency  . Obesity   Current Outpatient Prescriptions on File Prior to Visit    Medication Sig Dispense Refill  . ergocalciferol (VITAMIN D2) 50000 UNITS capsule Take 50,000 Units by mouth once a week.        . fish oil-omega-3 fatty acids 1000 MG capsule Take by mouth daily.        Marland Kitchen FLUoxetine (PROZAC) 40 MG capsule Take 1 capsule (40 mg total) by mouth daily.  90 capsule  1  . metoprolol succinate (TOPROL XL) 50 MG 24 hr tablet Take 1 tablet (50 mg total) by mouth daily.  90 tablet  3  . Multiple Vitamins-Minerals (MULTIVITAMINS THER. W/MINERALS) TABS Take 1 tablet by mouth daily.             Review of Systems    see HPI Objective:   Physical Exam Physical Exam  Nursing note and vitals reviewed.  Constitutional: She is oriented to person, place, and time. She appears well-developed and well-nourished.  HENT:  Head: Normocephalic and atraumatic.  Cardiovascular: Normal rate and regular rhythm. Exam reveals no gallop and no friction rub.  No murmur heard.  Pulmonary/Chest: Breath sounds normal. She has no wheezes. She has  no rales.' Abd:   Bpwel sound hyperactive.  No peritoneal signs no rebound no HSM  Neurological: She is alert and oriented to person, place, and time.  Skin: Skin is warm and dry.  Psychiatric: She has a normal mood and affect. Her behavior is normal.              Assessment & Plan:  Gastroenteritis  Immodium prn  Bland diet  Avoid dair yfor 2 weeks  Nausea  See above  Diarrhea  See above  See me if not better

## 2012-07-06 ENCOUNTER — Encounter: Payer: Self-pay | Admitting: *Deleted

## 2012-12-13 ENCOUNTER — Other Ambulatory Visit: Payer: Self-pay | Admitting: Internal Medicine

## 2012-12-18 ENCOUNTER — Other Ambulatory Visit: Payer: Self-pay | Admitting: *Deleted

## 2012-12-18 MED ORDER — FLUOXETINE HCL 40 MG PO CAPS: 40.0000 mg | ORAL_CAPSULE | Freq: Every day | ORAL | Status: DC

## 2012-12-18 NOTE — Telephone Encounter (Signed)
Refill request

## 2012-12-29 ENCOUNTER — Other Ambulatory Visit: Payer: Self-pay | Admitting: Internal Medicine

## 2013-01-01 NOTE — Telephone Encounter (Signed)
Refill request

## 2013-08-27 ENCOUNTER — Other Ambulatory Visit: Payer: Self-pay | Admitting: Internal Medicine

## 2013-08-27 NOTE — Telephone Encounter (Signed)
Refill request

## 2013-08-28 NOTE — Telephone Encounter (Signed)
Brenda Reeves   Call pt and let her know that I have not seen her since 06/2012.  I need to see her once a year.  I refillled #90 but give her 30 min visit with me

## 2013-08-28 NOTE — Telephone Encounter (Signed)
LVM message to return call regarding appt

## 2013-09-04 NOTE — Telephone Encounter (Signed)
Left 2ed VM message for pt to return call regarding appt

## 2014-04-08 ENCOUNTER — Encounter: Payer: Self-pay | Admitting: Internal Medicine

## 2015-06-24 MED FILL — levoFLOXacin 500 MG TABS: 500 | 5 days supply | Qty: 5 | Fill #0

## 2016-04-20 ENCOUNTER — Telehealth: Payer: 59 | Admitting: Family

## 2016-04-20 DIAGNOSIS — A499 Bacterial infection, unspecified: Secondary | ICD-10-CM | POA: Diagnosis not present

## 2016-04-20 DIAGNOSIS — N390 Urinary tract infection, site not specified: Secondary | ICD-10-CM

## 2016-04-20 MED ORDER — CIPROFLOXACIN HCL 500 MG PO TABS: 500.0000 mg | ORAL_TABLET | Freq: Two times a day (BID) | ORAL | 0 refills | Status: DC

## 2016-04-20 NOTE — Progress Notes (Signed)

## 2016-07-05 ENCOUNTER — Telehealth: Payer: 59 | Admitting: Family

## 2016-07-05 ENCOUNTER — Other Ambulatory Visit: Payer: Self-pay | Admitting: Family

## 2016-07-05 DIAGNOSIS — N390 Urinary tract infection, site not specified: Secondary | ICD-10-CM | POA: Diagnosis not present

## 2016-07-05 MED ORDER — CEPHALEXIN 500 MG PO CAPS: 500.0000 mg | ORAL_CAPSULE | Freq: Two times a day (BID) | ORAL | 0 refills | Status: DC

## 2016-07-05 NOTE — Progress Notes (Signed)

## 2017-02-26 ENCOUNTER — Telehealth: Payer: 59 | Admitting: Physician Assistant

## 2017-02-26 DIAGNOSIS — R300 Dysuria: Secondary | ICD-10-CM

## 2017-02-26 MED ORDER — CEPHALEXIN 500 MG PO CAPS: 500.0000 mg | ORAL_CAPSULE | Freq: Two times a day (BID) | ORAL | 0 refills | Status: AC

## 2017-02-26 NOTE — Progress Notes (Signed)

## 2017-08-17 ENCOUNTER — Telehealth: Payer: 59 | Admitting: Nurse Practitioner

## 2017-08-17 DIAGNOSIS — N3000 Acute cystitis without hematuria: Secondary | ICD-10-CM

## 2017-08-17 MED ORDER — CIPROFLOXACIN HCL 500 MG PO TABS: 500.0000 mg | ORAL_TABLET | Freq: Two times a day (BID) | ORAL | 0 refills | Status: DC

## 2017-08-17 NOTE — Progress Notes (Signed)

## 2017-11-02 ENCOUNTER — Telehealth: Payer: 59 | Admitting: Nurse Practitioner

## 2017-11-02 DIAGNOSIS — R05 Cough: Secondary | ICD-10-CM

## 2017-11-02 DIAGNOSIS — R059 Cough, unspecified: Secondary | ICD-10-CM

## 2017-11-02 MED ORDER — AZITHROMYCIN 250 MG PO TABS
ORAL_TABLET | ORAL | 0 refills | Status: DC
Start: 2017-11-02 — End: 2017-12-13

## 2017-11-02 NOTE — Progress Notes (Signed)
We are sorry that you are not feeling well.  Here is how we plan to help!  Based on your presentation I believe you most likely have A cough due to bacteria.  When patients have a fever and a productive cough with a change in color or increased sputum production, we are concerned about bacterial bronchitis.  If left untreated it can progress to pneumonia.  If your symptoms do not improve with your treatment plan it is important that you contact your provider.   I have prescribed Azithromyin 250 mg: two tablets now and then one tablet daily for 4 additonal days    In addition you may use A non-prescription cough medication called Robitussin DAC. Take 2 teaspoons every 8 hours or Delsym: take 2 teaspoons every 12 hours.    From your responses in the eVisit questionnaire you describe inflammation in the upper respiratory tract which is causing a significant cough.  This is commonly called Bronchitis and has four common causes:    Allergies  Viral Infections  Acid Reflux  Bacterial Infection Allergies, viruses and acid reflux are treated by controlling symptoms or eliminating the cause. An example might be a cough caused by taking certain blood pressure medications. You stop the cough by changing the medication. Another example might be a cough caused by acid reflux. Controlling the reflux helps control the cough.  USE OF BRONCHODILATOR ("RESCUE") INHALERS: There is a risk from using your bronchodilator too frequently.  The risk is that over-reliance on a medication which only relaxes the muscles surrounding the breathing tubes can reduce the effectiveness of medications prescribed to reduce swelling and congestion of the tubes themselves.  Although you feel brief relief from the bronchodilator inhaler, your asthma may actually be worsening with the tubes becoming more swollen and filled with mucus.  This can delay other crucial treatments, such as oral steroid medications. If you need to use a  bronchodilator inhaler daily, several times per day, you should discuss this with your provider.  There are probably better treatments that could be used to keep your asthma under control.     HOME CARE . Only take medications as instructed by your medical team. . Complete the entire course of an antibiotic. . Drink plenty of fluids and get plenty of rest. . Avoid close contacts especially the very young and the elderly . Cover your mouth if you cough or cough into your sleeve. . Always remember to wash your hands . A steam or ultrasonic humidifier can help congestion.   GET HELP RIGHT AWAY IF: . You develop worsening fever. . You become short of breath . You cough up blood. . Your symptoms persist after you have completed your treatment plan MAKE SURE YOU   Understand these instructions.  Will watch your condition.  Will get help right away if you are not doing well or get worse.  Your e-visit answers were reviewed by a board certified advanced clinical practitioner to complete your personal care plan.  Depending on the condition, your plan could have included both over the counter or prescription medications. If there is a problem please reply  once you have received a response from your provider. Your safety is important to [place].  If you have drug allergies check your prescription carefully.    You can use MyChart to ask questions about today's visit, request a non-urgent call back, or ask for a work or school excuse for 24 hours related to this e-Visit. If it has  greater than 24 hours you will need to follow up with your provider, or enter a new e-Visit to address those concerns. You will get an e-mail in the next two days asking about your experience.  I hope that your e-visit has been valuable and will speed your recovery. Thank you for using e-visits.   

## 2017-12-12 NOTE — Progress Notes (Addendum)
Cardiology Office Note:    Date:  12/13/2017   ID:  Brenda Reeves, Brenda Reeves 04-13-1946, MRN [id number]  PCP:  Maurice Small, MD  Cardiologist:  Buford Dresser, MD PhD  Referring MD: Lanice Shirts, *   Chief Complaint  Patient presents with  . New Patient (Initial Visit)    pt denied chest pain    History of Present Illness:    Brenda Reeves is a 71 y.o. female with a hx of palpitations, hypertension, obesity seen in consult at the request of Dr. Coralyn Mark for evaluationa and management of cardiac risk factors.  Patient requests: establish care, prevention Concerns: has had prior knee replacement in her 50s,wants to make sure her heart is ok before considering replacing the other knee. Feels tired on metoprolol, PCP said ok to stop. Initially on 100 mg XL daily, cut back to 50 mg XL daily. Was started for fast heart rate about 20 years ago.  She snores at night, rarely (about twice a year) wakes up gasping. But she sleeps on her side, so wasn't sure she can wear a mask. She would be be willing to pursue. Epworth sleepiness scale =2, endorses hypertension, stress, nocturia, snoring, obesity, and occasional waking up gasping as risk factors.   Palpitations: once every few months. Lasts a few seconds. No triggering symptoms. No syncope, chest pain, lightheadedness. Has been going on for a few years. No limitations when they come on. Resolve spontaneously.   Never had chest pain. Breathing is overall good, though she notes deconditioning with long walking. Can take stairs, might be winded but doesn't need to stop. No orthopnea,no real PND (those twice yearly episodes only times). Sleeps on side with one pillow. No syncope. Has prior BPPV but no lightheadedness. No lower extremity edema. Weight has been stable but higher than she'd like. Is an emotional eater. Working on increasing activity.  Never smoker. Tried on simvastatin (she believes) many years ago, had muscle pain.     She is limited in her mobility due to knee pain, but she is amenable to trying to increase her activity. She tries to follow a healthy diet and eats fish, etc.   Father died at age 38 for heart disease. Brother has had MIx2. Mother had cardiomyopathy and pacemaker, passed away from cancer at age 32.  Past Medical History:  Diagnosis Date  . Anxiety   . Arthritis   . Depression    on Prozac for 15 years  . Hemorrhoids   . Hyperlipidemia    intolerant to statins  . Hypertension   . IBS (irritable colon syndrome)   . Menopause   . PONV (postoperative nausea and vomiting)     Past Surgical History:  Procedure Laterality Date  . BREAST ENHANCEMENT SURGERY    . CESAREAN SECTION    . COLONOSCOPY    . REPLACEMENT TOTAL KNEE     left knee  . TONSILLECTOMY      Current Medications: Current Meds  Medication Sig  . FLUoxetine (PROZAC) 40 MG capsule TAKE 1 CAPSULE BY MOUTH DAILY.  . metoprolol succinate (TOPROL-XL) 50 MG 24 hr tablet TAKE 1 TABLET BY MOUTH DAILY.  . Multiple Vitamins-Minerals (MULTIVITAMINS THER. W/MINERALS) TABS Take 1 tablet by mouth daily.       Allergies:   Sulfa antibiotics   Social History   Socioeconomic History  . Marital status: Married    Spouse name: Not on file  . Number of children: 2  . Years of education:  Not on file  . Highest education level: Not on file  Occupational History  . Occupation: [employer] [place] [employer]: [employer]  Social Needs  . Financial resource strain: Not on file  . Food insecurity:    Worry: Not on file    Inability: Not on file  . Transportation needs:    Medical: Not on file    Non-medical: Not on file  Tobacco Use  . Smoking status: Never Smoker  . Smokeless tobacco: Never Used  Substance and Sexual Activity  . Alcohol use: No  . Drug use: No  . Sexual activity: Not Currently  Lifestyle  . Physical activity:    Days per week: Not on file    Minutes per session: Not on file  .  Stress: Not on file  Relationships  . Social connections:    Talks on phone: Not on file    Gets together: Not on file    Attends religious service: Not on file    Active member of club or organization: Not on file    Attends meetings of clubs or organizations: Not on file    Relationship status: Not on file  Other Topics Concern  . Not on file  Social History Narrative  . Not on file     Family History: The patient's family history includes Cancer in her mother; Colon cancer in her mother; Heart disease in her father and mother. Father died at age 16 for heart disease. Brother has had MIx2. Mother had cardiomyopathy and pacemaker, passed away from cancer at age 54.  ROS:   Please see the history of present illness.  Additional pertinent ROS: Review of Systems - General ROS: negative for - chills, fatigue, fever, malaise or night sweats Ophthalmic ROS: negative for - blurry vision or decreased vision ENT ROS: negative for - hearing change, sore throat or tinnitus Allergy and Immunology ROS: negative for - seasonal allergies Hematological and Lymphatic ROS: negative for - bleeding problems or bruising Endocrine ROS: negative for - skin changes or temperature intolerance Respiratory ROS: no cough, shortness of breath, or wheezing Cardiovascular ROS: no chest pain or dyspnea on exertion positive for - palpitations negative for - chest pain, dyspnea on exertion, edema, loss of consciousness, orthopnea or shortness of breath Gastrointestinal ROS: no abdominal pain, change in bowel habits, or black or bloody stools Genito-Urinary ROS: no dysuria, trouble voiding, or hematuria  EKGs/Labs/Other Studies Reviewed:    The following studies were reviewed today: prior ecgs  EKG:  EKG is ordered today.  The ekg ordered today demonstrates normal sinus rhythm.  Recent Labs: No results found for requested labs within last 8760 hours.  Recent Lipid Panel    Component Value Date/Time   CHOL  223 (H) 01/21/2011 1115   TRIG 90 01/21/2011 1115   HDL 52 01/21/2011 1115   CHOLHDL 4.3 01/21/2011 1115   VLDL 18 01/21/2011 1115   LDLCALC 153 (H) 01/21/2011 1115    Physical Exam:    VS:  BP 122/76   Pulse 89   Ht 5\' 6"  (1.676 m)   Wt 245 lb (111.1 kg)   BMI 39.54 kg/m     Wt Readings from Last 3 Encounters:  12/13/17 245 lb (111.1 kg)  06/29/12 245 lb (111.1 kg)  12/28/11 250 lb (113.4 kg)    GEN: Well nourished, well developed in no acute distress HEENT: Normal NECK: No JVD; No carotid bruits LYMPHATICS: No lymphadenopathy CARDIAC: regular rhythm,  normal S1 and S2, no murmurs, rubs, gallops RESPIRATORY:  Clear to auscultation without rales, wheezing or rhonchi  ABDOMEN: Soft, non-tender, non-distended MUSCULOSKELETAL:  No edema; No deformity  SKIN: Warm and dry NEUROLOGIC:  Alert and oriented x 3 PSYCHIATRIC:  Normal affect   ASSESSMENT:    1. Counseling on health promotion and disease prevention   2. Intermittent palpitations   3. At risk for cardiovascular event   4. Class 2 obesity due to excess calories without serious comorbidity with body mass index (BMI) of 39.0 to 39.9 in adult   5. Essential hypertension    PLAN:    In order of problems listed above:  1. Primary prevention of cardiovascular disease: discussed diet and exercise recommendations at length. She does fall into the category of class II obesity. -I suggest walking at least 30 minutes three to five times per week at a pace of 3 miles/hr (and therefore walking 1.5 miles in 30 minutes). This is the target; it may take time to build up to this. Studies have shown that this amount of walking decreases the changes of hospitalization or death, especially as people age. -we discussed dietary recommendations  -we discussed that weight loss, while a goal, takes time and should be done in a health manner. -she is going to try to increase her activity. If her knee pain prevents her from walking, she will  look into swim therapy at her senior center. -we briefly discussed ASCVD calculation and aspirin and statin guidelines. She had a prior reaction on simvastatin (she believes) many years ago. She will gt lipids and A1c drawn at her next PCP visit so that we can discuss options further at her next visit.  2. Palpitations: -These are brief, rare, and not troublesome at this time. Unlikely to catch with a Holter monitor or even with a 30 day event monitor at this time. -we are weaning down the metoprolol to eventually stop (see below). This may unmask palpitations. If this occurs, will order monitor for her -ECG normal sinus rhythm today. -she will bring a log of events to her next visit to see if we can determine aggravating or alleviating factors.  3. Hypertension: -Normotensive today on 50 mg metoprolol XL. As this is weaning down, we will see what happens with her blood pressure. As of right now, with no history of ASCVD her threshold for treatment would be above 140/90. -stopping metoprolol on a slow wean given her symptoms of fatigue with it. If antihypertensive therapy is required, would try something other than a beta blocker. Thiazide diuretic, ACEI, or amlodipine would be options. -concern given her symptoms and Epworth score that she may have sleep apnea. She is amenable to sleep study, and if positive for obstructive sleep apnea she would be open to trying nasal pillows or another mask that would allow her to sleep on her side. Will order sleep study for her.  Follow up in 6 mos to monitor hypertension, palpitations, and further discuss risk.  Medication Adjustments/Labs and Tests Ordered: Current medicines are reviewed at length with the patient today.  Concerns regarding medicines are outlined above.  Orders Placed This Encounter  Procedures  . EKG 12-Lead   No orders of the defined types were placed in this encounter.   Patient Instructions  Medication Instructions: Take  Metoprolol 25 mg( 1/2 tablet) for 6 days then stop   If you need a refill on your cardiac medications before your next appointment, please call your pharmacy.  Follow-Up: Your physician wants you to follow-up in 6 months with Dr. Harrell Gave.  Special Instructions:    Thank you for choosing Heartcare at [facility]!!       Signed, Buford Dresser, MD PhD 12/13/2017 10:57 AM    [place]

## 2017-12-13 ENCOUNTER — Encounter: Payer: Self-pay | Admitting: Cardiology

## 2017-12-13 ENCOUNTER — Ambulatory Visit: Payer: Medicare Other | Admitting: Cardiology

## 2017-12-13 VITALS — BP 122/76 | HR 89 | Ht 66.0 in | Wt 245.0 lb

## 2017-12-13 DIAGNOSIS — E6609 Other obesity due to excess calories: Secondary | ICD-10-CM

## 2017-12-13 DIAGNOSIS — Z7189 Other specified counseling: Secondary | ICD-10-CM

## 2017-12-13 DIAGNOSIS — R002 Palpitations: Secondary | ICD-10-CM | POA: Diagnosis not present

## 2017-12-13 DIAGNOSIS — Z9189 Other specified personal risk factors, not elsewhere classified: Secondary | ICD-10-CM | POA: Diagnosis not present

## 2017-12-13 DIAGNOSIS — I10 Essential (primary) hypertension: Secondary | ICD-10-CM

## 2017-12-13 DIAGNOSIS — Z6839 Body mass index (BMI) 39.0-39.9, adult: Secondary | ICD-10-CM

## 2017-12-13 NOTE — Patient Instructions (Signed)
Medication Instructions: Take Metoprolol 25 mg( 1/2 tablet) for 6 days then stop   If you need a refill on your cardiac medications before your next appointment, please call your pharmacy.      Follow-Up: Your physician wants you to follow-up in 6 months with Dr. Harrell Gave.  Special Instructions:    Thank you for choosing Heart[facility] at [facility]!!

## 2017-12-14 ENCOUNTER — Telehealth: Payer: Self-pay | Admitting: *Deleted

## 2017-12-14 ENCOUNTER — Telehealth: Payer: Self-pay

## 2017-12-14 DIAGNOSIS — G4730 Sleep apnea, unspecified: Secondary | ICD-10-CM

## 2017-12-14 NOTE — Telephone Encounter (Signed)
Spoke with pt and informed of Dr. Harrell Gave recommendation for a sleep study and that she would be receiving a call from scheduling to set up appointment. Pt verbalized understanding.

## 2017-12-14 NOTE — Telephone Encounter (Signed)
Left message for pt to call back to inform that Dr. Harrell Gave would like for her to have a sleep study. Orders placed.

## 2017-12-14 NOTE — Telephone Encounter (Signed)
Left message to return a call to discuss sleep appointment details.

## 2017-12-14 NOTE — Telephone Encounter (Signed)
-----   Message from Meryl Crutch, RN sent at 12/14/2017 11:09 AM EDT ----- Orders placed for sleep study.

## 2018-01-13 ENCOUNTER — Encounter (HOSPITAL_BASED_OUTPATIENT_CLINIC_OR_DEPARTMENT_OTHER): Payer: 59

## 2018-05-08 ENCOUNTER — Encounter (HOSPITAL_BASED_OUTPATIENT_CLINIC_OR_DEPARTMENT_OTHER): Payer: 59

## 2018-06-05 ENCOUNTER — Encounter (HOSPITAL_BASED_OUTPATIENT_CLINIC_OR_DEPARTMENT_OTHER): Payer: 59

## 2018-11-15 ENCOUNTER — Telehealth: Payer: Self-pay | Admitting: Cardiology

## 2018-11-15 NOTE — Telephone Encounter (Signed)
LMTCB - Patient past due for 6 month check up with DR Harrell Gave.  She was due in January.  Please schedule next week with Harrell Gave.

## 2019-01-07 ENCOUNTER — Telehealth: Payer: 59 | Admitting: Family

## 2019-01-07 DIAGNOSIS — R300 Dysuria: Secondary | ICD-10-CM

## 2019-01-07 MED ORDER — NITROFURANTOIN MONOHYD MACRO 100 MG PO CAPS: 100.0000 mg | ORAL_CAPSULE | Freq: Two times a day (BID) | ORAL | 0 refills | Status: DC

## 2019-01-07 NOTE — Progress Notes (Signed)

## 2019-03-12 ENCOUNTER — Telehealth: Payer: 59 | Admitting: Cardiology

## 2019-03-16 ENCOUNTER — Telehealth (INDEPENDENT_AMBULATORY_CARE_PROVIDER_SITE_OTHER): Payer: 59 | Admitting: Cardiology

## 2019-03-16 VITALS — Ht 66.0 in | Wt 239.0 lb

## 2019-03-16 DIAGNOSIS — Z6838 Body mass index (BMI) 38.0-38.9, adult: Secondary | ICD-10-CM

## 2019-03-16 DIAGNOSIS — E785 Hyperlipidemia, unspecified: Secondary | ICD-10-CM

## 2019-03-16 DIAGNOSIS — F329 Major depressive disorder, single episode, unspecified: Secondary | ICD-10-CM

## 2019-03-16 DIAGNOSIS — Z01810 Encounter for preprocedural cardiovascular examination: Secondary | ICD-10-CM

## 2019-03-16 DIAGNOSIS — I10 Essential (primary) hypertension: Secondary | ICD-10-CM | POA: Diagnosis not present

## 2019-03-16 DIAGNOSIS — Z7189 Other specified counseling: Secondary | ICD-10-CM

## 2019-03-16 DIAGNOSIS — F32A Depression, unspecified: Secondary | ICD-10-CM

## 2019-03-16 NOTE — Patient Instructions (Signed)
Medication Instructions:  Your Physician recommend you continue on your current medication as directed.    If you need a refill on your cardiac medications before your next appointment, please call your pharmacy.   Lab work: None If you have labs (blood work) drawn today and your tests are completely normal, you will receive your results only by: . MyChart Message (if you have MyChart) OR . A paper copy in the mail If you have any lab test that is abnormal or we need to change your treatment, we will call you to review the results.  Testing/Procedures: None  Follow-Up: At [facility], you and your health needs are our priority.  As part of our continuing mission to provide you with exceptional heart care, we have created designated Provider Care Teams.  These Care Teams include your primary Cardiologist (physician) and Advanced Practice Providers (APPs -  Physician Assistants and Nurse Practitioners) who all work together to provide you with the care you need, when you need it. You will need a follow up appointment in 3 months.  Please call our office 2 months in advance to schedule this appointment.  You may see Bridgette Christopher, MD or one of the following Advanced Practice Providers on your designated Care Team:   Rhonda Barrett, PA-C . Kathryn Lawrence, DNP, ANP    

## 2019-03-16 NOTE — Progress Notes (Signed)
Virtual Visit via Video Note   This visit type was conducted due to national recommendations for restrictions regarding the COVID-19 Pandemic (e.g. social distancing) in an effort to limit this patient's exposure and mitigate transmission in our community.  Due to her co-morbid illnesses, this patient is at least at moderate risk for complications without adequate follow up.  This format is felt to be most appropriate for this patient at this time.  All issues noted in this document were discussed and addressed.  A limited physical exam was performed with this format.  Please refer to the patient's chart for her consent to telehealth for [facility].   Date:  03/16/2019   ID:  Brenda Reeves, Brenda Reeves [birth year], MRN [id number]  Patient Location: Home Provider Location: Home  PCP:  Maurice Small, MD  Cardiologist:  Buford Dresser, MD  Electrophysiologist:  None   Evaluation Performed:  Follow-Up Visit  Chief Complaint:  Preoperative cardiovascular exam, follow up  History of Present Illness:    Brenda Reeves is a 73 y.o. female with hx of palpitations, hypertension, obesity. She is seen for preoperative cardiovascular evaluation and general follow up.  The patient does not have symptoms concerning for COVID-19 infection (fever, chills, cough, or new shortness of breath).   Preoperative cardiovascular evaluation  Planned surgery: knee replacement planned at [facility], 04/09/19 with Dr. Pennie Rushing. Planned for spinal anesthesia  Pertinent past cardiac history: hypertension Prior cardiac workup: none History of valve disease: none History of CAD/PAD/CVA/TIA: none History of heart failure: none History of arrhythmia: none On anticoagulation: none currently, was briefly on post knee surgery 13 year ago Additional history: Yes: hypertension, NO: diabetes/on insulin, CKD. Uncertain OSA, was discussed at our prior visit. Current symptoms: Denies chest pain, shortness of breath  at rest or with normal exertion. No PND, orthopnea, LE edema or unexpected weight gain. No syncope or palpitations. Functional capacity: can climb stairs slowly, walk, work around the house, limitations due to knee and not chest pain/shortness of breath.  Has had a lot of anxiety and depression, was on Prozac years ago. Feels very down with this. Hasn't seen anyone to talk about it. She hasn't seen anyone in person as a PCP in some time. She will be going to [place] for post-op knee rehab and wondering if there is a PCP office closer to there. We discussed below.  Doesn't check BP at home. Was very nervous when she saw the surgeon, 170/83. Constant pain 8/10 all the time. Has previously been 120s/70s before knee pain became significantly worse.   Has lost some weight, motivated to continue to lose once her knee surgery is complete.  Past Medical History:  Diagnosis Date  . Anxiety   . Arthritis   . Depression    on Prozac for 15 years  . Hemorrhoids   . Hyperlipidemia    intolerant to statins  . Hypertension   . IBS (irritable colon syndrome)   . Menopause   . PONV (postoperative nausea and vomiting)    Past Surgical History:  Procedure Laterality Date  . BREAST ENHANCEMENT SURGERY    . CESAREAN SECTION    . COLONOSCOPY    . REPLACEMENT TOTAL KNEE     left knee  . TONSILLECTOMY       Current Meds  Medication Sig  . Cholecalciferol (VITAMIN D3) 125 MCG (5000 UT) CAPS Take by mouth.  . Cyanocobalamin (B-12) 5000 MCG CAPS Take by mouth.  Marland Kitchen ibuprofen (ADVIL) 200 MG  tablet Take 400 mg by mouth every 6 (six) hours as needed.  . Multiple Vitamins-Minerals (MULTIVITAMINS THER. W/MINERALS) TABS Take 1 tablet by mouth daily.    Marland Kitchen zinc gluconate 50 MG tablet Take 50 mg by mouth daily.     Allergies:   Sulfa antibiotics   Social History   Tobacco Use  . Smoking status: Never Smoker  . Smokeless tobacco: Never Used  Substance Use Topics  . Alcohol use: No  . Drug use: No      Family Hx: The patient's family history includes Cancer in her mother; Colon cancer in her mother; Heart disease in her father and mother.  ROS:   Please see the history of present illness.    Constitutional: Negative for chills, fever, night sweats, unintentional weight loss  HENT: Negative for ear pain and hearing loss.   Eyes: Negative for loss of vision and eye pain.  Respiratory: Negative for cough, sputum, wheezing.   Cardiovascular: See HPI. Gastrointestinal: Negative for abdominal pain, melena, and hematochezia.  Genitourinary: Negative for dysuria and hematuria.  Musculoskeletal: Negative for falls and myalgias.  Skin: Negative for itching and rash.  Neurological: Negative for focal weakness, focal sensory changes and loss of consciousness.  Endo/Heme/Allergies: Does not bruise/bleed easily.  All other systems reviewed and are negative.   Prior CV studies:   The following studies were reviewed today: No available cardiac studies  Labs/Other Tests and Data Reviewed:    EKG:  An ECG dated 12/13/2017 was personally reviewed today and demonstrated:  NSR  Recent Labs: No results found for requested labs within last 8760 hours.   Recent Lipid Panel Lab Results  Component Value Date/Time   CHOL 223 (H) 01/21/2011 11:15 AM   TRIG 90 01/21/2011 11:15 AM   HDL 52 01/21/2011 11:15 AM   CHOLHDL 4.3 01/21/2011 11:15 AM   LDLCALC 153 (H) 01/21/2011 11:15 AM    Wt Readings from Last 3 Encounters:  03/16/19 239 lb (108.4 kg)  12/13/17 245 lb (111.1 kg)  06/29/12 245 lb (111.1 kg)     Objective:    Vital Signs:  Ht 5\' 6"  (1.676 m)   Wt 239 lb (108.4 kg)   BMI 38.58 kg/m    VITAL SIGNS:  reviewed GEN:  no acute distress EYES:  sclerae anicteric, EOMI - Extraocular Movements Intact RESPIRATORY:  normal respiratory effort, symmetric expansion CARDIOVASCULAR:  no visible JVD SKIN:  no rash, lesions or ulcers. MUSCULOSKELETAL:  no obvious deformities. NEURO:  alert  and oriented x 3, no obvious focal deficit PSYCH:  normal affect  ASSESSMENT & PLAN:    Preoperative cardiovascular exam: RCRI risk score=0, can just achieve 4 METs of activity without issue. Planned for spinal anesthesia According to ACC/AHA guidelines, no further cardiovascular testing needed.  The patient may proceed to surgery at acceptable risk.     Hypertension: threshold for treatment >140/90 when not in pain -no home cuff. Prior readings have been very good, but when she is in pain her BP spikes -we discussed home cuffs, she will purchase an arm cuff -counseled on how to check blood pressure:  -sit comfortably in a chair, feet uncrossed and flat on floor, for 5-10 minutes  -arm ideally should rest at the level of the heart. However, arm should be relaxed and not tense (for example, do not hold the arm up unsupported)  -avoid exercise, caffeine, and tobacco for at least 30 minutes prior to BP reading  -don't take BP cuff reading over clothes (  always place on skin directly)  -I prefer to know how well the medication is working, so I would like you to take your readings 1-2 hours after taking your blood pressure medication if possible -if her BP remains elevated after her surgery/pain is treated, will need to discuss restarting medications -was on metoprolol in the past, had fatigue (and not ideal with her history of depression) -would consider thiazide diuretic, ACEI, or amlodipine  Possible sleep apnea: -concern given her symptoms and Epworth score that she may have sleep apnea. She is amenable to sleep study, and if positive for obstructive sleep apnea she would be open to trying nasal pillows or another mask that would allow her to sleep on her side.  -sleep study ordered but not completed, she is considering -having spinal anesthesia for knee, lower risk for post op apnea than general anesthesia   Primary prevention of cardiovascular disease: She does fall into the category of  class II obesity. -recommend heart healthy/Mediterranean diet, with whole grains, fruits, vegetable, fish, lean meats, nuts, and olive oil. Limit salt. -recommend moderate walking, 3-5 times/week for 30-50 minutes each session. Aim for at least 150 minutes.week. Goal should be pace of 3 miles/hours, or walking 1.5 miles in 30 minutes -recommend avoidance of tobacco products. Avoid excess alcohol. -has had myalgia on statin in the past. Due for recheck of lipids and further discussion. We will see each other in person in 3 mos for lipids and BP review  Depression: endorses feeling down/depressed mood, sleep disturbances. No severe/life threatening symptoms today. Wonders if she needs to go back on prozac, which she was on for >15 years -we discussed today. She wants to get through surgery and see if that helps. If not, she wants to speak to a provider about medications -she will be rehabbing at [facility] and asked if there was a PCP office nearby. There is a [place] office across the stress. I gave her the name of Dr. Casimer Bilis. She will consider and call when she is ready  COVID-19 Education: The signs and symptoms of COVID-19 were discussed with the patient and how to seek care for testing (follow up with PCP or arrange E-visit).  The importance of social distancing was discussed today.  Time:   Today, I have spent 27 minutes with the patient with telehealth technology discussing the above problems.     Medication Adjustments/Labs and Tests Ordered: Current medicines are reviewed at length with the patient today.  Concerns regarding medicines are outlined above.   Patient Instructions  Medication Instructions:  Your Physician recommend you continue on your current medication as directed.    If you need a refill on your cardiac medications before your next appointment, please call your pharmacy.   Lab work: None If you have labs (blood work) drawn today and your  tests are completely normal, you will receive your results only by: Marland Kitchen MyChart Message (if you have MyChart) OR . A paper copy in the mail If you have any lab test that is abnormal or we need to change your treatment, we will call you to review the results.  Testing/Procedures: None  Follow-Up: At [facility], you and your health needs are our priority.  As part of our continuing mission to provide you with exceptional heart care, we have created designated Provider Care Teams.  These Care Teams include your primary Cardiologist (physician) and Advanced Practice Providers (APPs -  Physician Assistants and Nurse Practitioners) who all  work together to provide you with the care you need, when you need it. You will need a follow up appointment in 3 months.  Please call our office 2 months in advance to schedule this appointment.  You may see Buford Dresser, MD or one of the following Advanced Practice Providers on your designated Care Team:   Rosaria Ferries, PA-C . Jory Sims, DNP, ANP       Signed, Buford Dresser, MD  03/16/2019  [facility]

## 2019-03-18 ENCOUNTER — Encounter: Payer: Self-pay | Admitting: Cardiology

## 2019-05-25 ENCOUNTER — Other Ambulatory Visit: Payer: Self-pay

## 2019-05-25 ENCOUNTER — Encounter (HOSPITAL_COMMUNITY): Payer: Self-pay

## 2019-05-25 ENCOUNTER — Emergency Department (HOSPITAL_COMMUNITY): Payer: Medicare Other

## 2019-05-25 ENCOUNTER — Emergency Department (HOSPITAL_COMMUNITY)
Admission: EM | Admit: 2019-05-25 | Discharge: 2019-05-25 | Disposition: A | Discharge status: Home / Self Care | Payer: Medicare Other | Attending: Emergency Medicine | Admitting: Emergency Medicine

## 2019-05-25 DIAGNOSIS — N390 Urinary tract infection, site not specified: Secondary | ICD-10-CM

## 2019-05-25 DIAGNOSIS — Z7982 Long term (current) use of aspirin: Secondary | ICD-10-CM | POA: Diagnosis not present

## 2019-05-25 DIAGNOSIS — Z79899 Other long term (current) drug therapy: Secondary | ICD-10-CM | POA: Insufficient documentation

## 2019-05-25 DIAGNOSIS — I10 Essential (primary) hypertension: Secondary | ICD-10-CM | POA: Diagnosis not present

## 2019-05-25 DIAGNOSIS — Z9889 Other specified postprocedural states: Secondary | ICD-10-CM | POA: Insufficient documentation

## 2019-05-25 DIAGNOSIS — D72829 Elevated white blood cell count, unspecified: Secondary | ICD-10-CM | POA: Diagnosis not present

## 2019-05-25 DIAGNOSIS — R000 Tachycardia, unspecified: Secondary | ICD-10-CM | POA: Diagnosis present

## 2019-05-25 LAB — CBC WITH DIFFERENTIAL/PLATELET
Abs Immature Granulocytes: 0.05 K/uL (ref 0.00–0.07)
Basophils Absolute: 0 K/uL (ref 0.0–0.1)
Basophils Relative: 0 %
Eosinophils Absolute: 0.1 K/uL (ref 0.0–0.5)
Eosinophils Relative: 1 %
HCT: 43.3 % (ref 36.0–46.0)
Hemoglobin: 13.4 g/dL (ref 12.0–15.0)
Immature Granulocytes: 0 %
Lymphocytes Relative: 12 %
Lymphs Abs: 1.6 K/uL (ref 0.7–4.0)
MCH: 26.8 pg (ref 26.0–34.0)
MCHC: 30.9 g/dL (ref 30.0–36.0)
MCV: 86.6 fL (ref 80.0–100.0)
Monocytes Absolute: 0.9 K/uL (ref 0.1–1.0)
Monocytes Relative: 7 %
Neutro Abs: 10.8 K/uL — ABNORMAL HIGH (ref 1.7–7.7)
Neutrophils Relative %: 80 %
Platelets: 298 K/uL (ref 150–400)
RBC: 5 MIL/uL (ref 3.87–5.11)
RDW: 14 % (ref 11.5–15.5)
WBC: 13.4 K/uL — ABNORMAL HIGH (ref 4.0–10.5)
nRBC: 0 % (ref 0.0–0.2)

## 2019-05-25 LAB — URINALYSIS, ROUTINE W REFLEX MICROSCOPIC
Bilirubin Urine: NEGATIVE
Glucose, UA: NEGATIVE mg/dL
Hgb urine dipstick: NEGATIVE
Ketones, ur: NEGATIVE mg/dL
Nitrite: POSITIVE — AB
Protein, ur: NEGATIVE mg/dL
Specific Gravity, Urine: 1.005 (ref 1.005–1.030)
pH: 5 (ref 5.0–8.0)

## 2019-05-25 LAB — BASIC METABOLIC PANEL WITH GFR
Anion gap: 11 (ref 5–15)
BUN: 17 mg/dL (ref 8–23)
CO2: 23 mmol/L (ref 22–32)
Calcium: 9.7 mg/dL (ref 8.9–10.3)
Chloride: 106 mmol/L (ref 98–111)
Creatinine, Ser: 0.91 mg/dL (ref 0.44–1.00)
GFR calc Af Amer: >60 mL/min
GFR calc non Af Amer: >60 mL/min
Glucose, Bld: 128 mg/dL — ABNORMAL HIGH (ref 70–99)
Potassium: 4 mmol/L (ref 3.5–5.1)
Sodium: 140 mmol/L (ref 135–145)

## 2019-05-25 LAB — D-DIMER, QUANTITATIVE: D-Dimer, Quant: 0.99 ug/mL-FEU — ABNORMAL HIGH (ref 0.00–0.50)

## 2019-05-25 LAB — TSH: TSH: 1.852 u[IU]/mL (ref 0.350–4.500)

## 2019-05-25 MED ORDER — IOHEXOL 350 MG/ML SOLN
100.0000 mL | Freq: Once | INTRAVENOUS | Status: AC | PRN
  Administered 2019-05-25: 100 mL via INTRAVENOUS

## 2019-05-25 MED ORDER — SODIUM CHLORIDE 0.9 % IV SOLN
1.0000 g | Freq: Once | INTRAVENOUS | Status: AC
  Administered 2019-05-25: 1 g via INTRAVENOUS
  Filled 2019-05-25: qty 10

## 2019-05-25 MED ORDER — SODIUM CHLORIDE (PF) 0.9 % IJ SOLN
INTRAMUSCULAR | Status: AC
  Filled 2019-05-25: qty 50

## 2019-05-25 MED ORDER — CEPHALEXIN 500 MG PO CAPS: 500.0000 mg | ORAL_CAPSULE | Freq: Three times a day (TID) | ORAL | 0 refills | Status: DC

## 2019-05-25 NOTE — ED Provider Notes (Signed)
[facility] DEPT Provider Note   CSN: [id number] Arrival date & time: 05/25/19  1641     History No chief complaint on file.   Brenda Reeves is a 73 y.o. female.  HPI   73 year old female sent for evaluation of leukocytosis and tachycardia.  Patient went to a postop check a couple days ago.  She is approximately 6 weeks postop from right knee replacement.  She has been to be tachycardic in the office so they did some blood work.  She reports a leukocytosis of 18,000.  They recommend that she follow-up with her PCP and obtain urinalysis.  Patient herself has no symptoms though.  She went to an urgent care facility today to have a urinalysis done.  They noted her heart rate was elevated and called EMS to bring her to the emergency room.  She still has no complaints.  She reports that her right knee has been healing well.  No acute pain anywhere.  No cough.  No dyspnea.  No fevers.  She states that she has been eating and drinking well.  She is still on aspirin for couple more days postop.  Denies any past history of DVT/PE.  No thyroid dysfunction that she is aware of.  Past Medical History:  Diagnosis Date  . Anxiety   . Arthritis   . Depression    on Prozac for 15 years  . Hemorrhoids   . Hyperlipidemia    intolerant to statins  . Hypertension   . IBS (irritable colon syndrome)   . Menopause   . PONV (postoperative nausea and vomiting)    Patient Active Problem List   Diagnosis Date Noted  . Intermittent palpitations 12/13/2017  . Gastroenteritis 06/29/2012  . Vitamin D deficiency 01/21/2011  . Obesity 01/21/2011  . Anxiety   . Arthritis   . IBS (irritable colon syndrome)   . Depression   . Essential hypertension   . Hyperlipidemia   . Hemorrhoids   . Menopause    Past Surgical History:  Procedure Laterality Date  . BREAST ENHANCEMENT SURGERY    . CESAREAN SECTION    . COLONOSCOPY    . REPLACEMENT TOTAL KNEE     left knee  .  TONSILLECTOMY      OB History    Gravida  3   Para  2   Term      Preterm      AB  1   Living        SAB      TAB      Ectopic      Multiple      Live Births             Family History  Problem Relation Age of Onset  . Colon cancer Mother   . Cancer Mother        liver  . Heart disease Mother        cardiomyopathy  . Heart disease Father        MI    Social History   Tobacco Use  . Smoking status: Never Smoker  . Smokeless tobacco: Never Used  Substance Use Topics  . Alcohol use: No  . Drug use: No    Home Medications Prior to Admission medications   Medication Sig Start Date End Date Taking? Authorizing Provider  acetaminophen (TYLENOL) 325 MG tablet Take 650 mg by mouth every 6 (six) hours as needed for mild pain.   Yes [provider]  aspirin 325 MG EC tablet Take 325 mg by mouth 2 (two) times daily. Stops taking 12.19.2020 04/10/19  Yes [provider]  Cholecalciferol (VITAMIN D3) 125 MCG (5000 UT) CAPS Take by mouth.   Yes [provider]  Cyanocobalamin (B-12) 5000 MCG CAPS Take 5,000 mcg by mouth daily.    Yes [provider]  ibuprofen (ADVIL) 200 MG tablet Take 400 mg by mouth every 6 (six) hours as needed.   Yes [provider]  Multiple Vitamins-Minerals (MULTIVITAMINS THER. W/MINERALS) TABS Take 1 tablet by mouth daily.     Yes [provider]  zinc gluconate 50 MG tablet Take 50 mg by mouth daily.   Yes [provider]   Allergies    Sulfa antibiotics  Review of Systems   Review of Systems All systems reviewed and negative, other than as noted in HPI.  Physical Exam Updated Vital Signs BP (!) 163/82 (BP Location: Right Arm)   Pulse (!) 121   Temp 98.8 F (37.1 C) (Oral)   Resp 19   Ht 5\' 3"  (1.6 m)   Wt 108.9 kg   SpO2 99%   BMI 42.51 kg/m   Physical Exam Vitals and nursing note reviewed.  Constitutional:      General: She is not in acute distress.     Appearance: She is well-developed.  HENT:     Head: Normocephalic and atraumatic.  Eyes:     General:        Right eye: No discharge.        Left eye: No discharge.     Conjunctiva/sclera: Conjunctivae normal.  Cardiovascular:     Rate and Rhythm: Regular rhythm. Tachycardia present.     Heart sounds: Normal heart sounds. No murmur. No friction rub. No gallop.   Pulmonary:     Effort: Pulmonary effort is normal. No respiratory distress.     Breath sounds: Normal breath sounds.  Abdominal:     General: There is no distension.     Palpations: Abdomen is soft.     Tenderness: There is no abdominal tenderness.  Musculoskeletal:        General: No tenderness.     Cervical back: Neck supple.     Comments: Right knee appears to be healing well.  Range of motion actively without discomfort.  No overlying skin changes.  No calf tenderness. Negative Homan's. No palpable cords.   Skin:    General: Skin is warm and dry.  Neurological:     Mental Status: She is alert.  Psychiatric:        Behavior: Behavior normal.        Thought Content: Thought content normal.    ED Results / Procedures / Treatments   Labs (all labs ordered are listed, but only abnormal results are displayed) Labs Reviewed  URINE CULTURE - Abnormal; Notable for the following components:      Result Value   Culture   (*)    Value: >=100,000 COLONIES/mL ESCHERICHIA COLI SUSCEPTIBILITIES TO FOLLOW Performed at [facility], [address], [place], [place] [address]    All other components within normal limits  CBC WITH DIFFERENTIAL/PLATELET - Abnormal; Notable for the following components:   WBC 13.4 (*)    Neutro Abs 10.8 (*)    All other components within normal limits  BASIC METABOLIC PANEL - Abnormal; Notable for the following components:   Glucose, Bld 128 (*)    All other components within  normal limits  URINALYSIS, ROUTINE W REFLEX MICROSCOPIC - Abnormal; Notable for the following components:    Nitrite POSITIVE (*)    Leukocytes,Ua SMALL (*)    Bacteria, UA FEW (*)    All other components within normal limits  D-DIMER, QUANTITATIVE (NOT AT [facility]) - Abnormal; Notable for the following components:   D-Dimer, Quant 0.99 (*)    All other components within normal limits  TSH  POC SARS CORONAVIRUS 2 AG -  ED   EKG EKG Interpretation  Date/Time:  Friday May 25 2019 17:22:39 EST Ventricular Rate:  120 PR Interval:    QRS Duration: 83 QT Interval:  316 QTC Calculation: 447 R Axis:   41 Text Interpretation: Sinus tachycardia Low voltage, precordial leads Confirmed by Virgel Manifold [phone]) on 05/25/2019 5:32:50 PM  Radiology No results found.  Procedures Procedures (including critical care time)  Medications Ordered in ED Medications - No data to display  ED Course  I have reviewed the triage vital signs and the nursing notes.  Pertinent labs & imaging results that were available during my care of the patient were reviewed by me and considered in my medical decision making (see chart for details).    MDM Rules/Calculators/A&P  73 year old female with tachycardia and leukocytosis.  Unclear etiology.  Apparently other providers concern for possible infectious etiology.  I am not convinced of this.  She is afebrile.  She really has no symptoms.  Her knee appears to be healing well.  Exam is not consistent with infection.  Consider PE given several weeks postop from orthopedic surgery.  She denies any dyspnea though.  She is not hypoxic.  She has no signs/symptoms of DVT.  Will check labs again.  UA.  Will check a D-dimer and TSH.  UA consistent with UTI. CTa negative for PE. Rocephin given in ED. Culture sent. Continued abx.   Final Clinical Impression(s) / ED Diagnoses Final diagnoses:  Urinary tract infection without hematuria, site unspecified    Rx / DC Orders ED Discharge Orders    None       Virgel Manifold, MD 05/27/19 2012

## 2019-05-25 NOTE — ED Triage Notes (Signed)
Patient brought from urgent care after having a check up appointment after R knee replacement surgery. Surgery occurred 6 weeks ago. Urgent care called EMS because patient had increased HR and increased WBC. Urgent care paperwork at bedside. Patient has no complaints and 0/10 pain.   144/88 130 HR 24 R 100% RA  35 CO2 97.3 temp 178 CBG

## 2019-05-25 NOTE — ED Notes (Signed)
Son called and left his information and asked for a phone call and update, Alizee, Keckler [phone]. Husband's name is Tashua Suplee, Sr. [phone].

## 2019-05-27 NOTE — Progress Notes (Deleted)
Cardiology Office Note   Date:  05/27/2019   ID:  Brenda Reeves, [place]ston 07-May-1946, MRN [id number]  PCP:  Patient, No Pcp Per  Cardiologist: Dr.Christopher  No chief complaint on file.    History of Present Illness: Brenda Reeves is a 73 y.o. female who presents for ongoing assessment and management of hypertension, history of palpitations with other history to include OSA and obesity.  She was last seen in the office on 03/16/2019 by Dr. Harrell Gave for preoperative evaluation to have planned right knee replacement in November 2020 with spinal anesthesia.  At that time she was cleared for surgery as a low risk.  Discussion for healthy diet and increase exercise to improve overall health and reduce cardiovascular risk factors was also discussed.  Tobacco cessation was also discussed.  She is due for recheck of fasting lipids and LFTs and blood pressure control evaluation on this office visit.  Brenda Reeves was seen in the emergency room on 05/25/2019 for UTI.  She was also found to be tachycardic.  UA revealed E. coli on evaluation report 05/27/2019.  She was also positive for nitrates with leukocytes.  White blood cells were elevated at 13.4.  Dr. Davina Poke Past Medical History:  Diagnosis Date  . Anxiety   . Arthritis   . Depression    on Prozac for 15 years  . Hemorrhoids   . Hyperlipidemia    intolerant to statins  . Hypertension   . IBS (irritable colon syndrome)   . Menopause   . PONV (postoperative nausea and vomiting)     Past Surgical History:  Procedure Laterality Date  . BREAST ENHANCEMENT SURGERY    . CESAREAN SECTION    . COLONOSCOPY    . REPLACEMENT TOTAL KNEE     left knee  . TONSILLECTOMY       Current Outpatient Medications  Medication Sig Dispense Refill  . acetaminophen (TYLENOL) 325 MG tablet Take 650 mg by mouth every 6 (six) hours as needed for mild pain.    Marland Kitchen aspirin 325 MG EC tablet Take 325 mg by mouth 2 (two) times daily. Stops taking  12.19.2020    . cephALEXin (KEFLEX) 500 MG capsule Take 1 capsule (500 mg total) by mouth 3 (three) times daily. 15 capsule 0  . Cholecalciferol (VITAMIN D3) 125 MCG (5000 UT) CAPS Take by mouth.    . Cyanocobalamin (B-12) 5000 MCG CAPS Take 5,000 mcg by mouth daily.     Marland Kitchen ibuprofen (ADVIL) 200 MG tablet Take 400 mg by mouth every 6 (six) hours as needed.    . Multiple Vitamins-Minerals (MULTIVITAMINS THER. W/MINERALS) TABS Take 1 tablet by mouth daily.      Marland Kitchen zinc gluconate 50 MG tablet Take 50 mg by mouth daily.     No current facility-administered medications for this visit.    Allergies:   Sulfa antibiotics    Social History:  The patient  reports that she has never smoked. She has never used smokeless tobacco. She reports that she does not drink alcohol or use drugs.   Family History:  The patient's family history includes Cancer in her mother; Colon cancer in her mother; Heart disease in her father and mother.    ROS: All other systems are reviewed and negative. Unless otherwise mentioned in H&P    PHYSICAL EXAM: VS:  There were no vitals taken for this visit. , BMI There is no height or weight on file to calculate BMI. GEN: Well nourished, well developed, in  no acute distress HEENT: normal Neck: no JVD, carotid bruits, or masses Cardiac: ***RRR; no murmurs, rubs, or gallops,no edema  Respiratory:  Clear to auscultation bilaterally, normal work of breathing GI: soft, nontender, nondistended, + BS MS: no deformity or atrophy Skin: warm and dry, no rash Neuro:  Strength and sensation are intact Psych: euthymic mood, full affect   EKG:  EKG {ACTION; IS/IS [id number] ordered today. The ekg ordered today demonstrates ***   Recent Labs: 05/25/2019: BUN 17; Creatinine, Ser 0.91; Hemoglobin 13.4; Platelets 298; Potassium 4.0; Sodium 140; TSH 1.852    Lipid Panel    Component Value Date/Time   CHOL 223 (H) 01/21/2011 1115   TRIG 90 01/21/2011 1115   HDL 52 01/21/2011  1115   CHOLHDL 4.3 01/21/2011 1115   VLDL 18 01/21/2011 1115   LDLCALC 153 (H) 01/21/2011 1115      Wt Readings from Last 3 Encounters:  05/25/19 240 lb (108.9 kg)  03/16/19 239 lb (108.4 kg)  12/13/17 245 lb (111.1 kg)      Other studies Reviewed: Additional studies/ records that were reviewed today include: ***. Review of the above records demonstrates: ***   ASSESSMENT AND PLAN:  1.  ***   Current medicines are reviewed at length with the patient today.    Labs/ tests ordered today include: *** Phill Myron. [place] Pugh, ANP, AACC   05/27/2019 12:08 PM    [facility] [place] [address] Office [phone] Fax [phone]  Notice: This dictation was prepared with Dragon dictation along with smaller phrase technology. Any transcriptional errors that result from this process are unintentional and may not be corrected upon review.

## 2019-05-28 ENCOUNTER — Ambulatory Visit: Payer: 59 | Admitting: Adult Health

## 2019-05-28 LAB — URINE CULTURE: Culture: >=100000 — AB

## 2019-06-11 ENCOUNTER — Ambulatory Visit: Payer: 59 | Admitting: Cardiology

## 2019-06-15 ENCOUNTER — Encounter: Payer: Self-pay | Admitting: Cardiology

## 2019-06-15 ENCOUNTER — Telehealth (INDEPENDENT_AMBULATORY_CARE_PROVIDER_SITE_OTHER): Payer: Medicare Other | Admitting: Cardiology

## 2019-06-15 VITALS — HR 79 | Ht 65.0 in | Wt 245.0 lb

## 2019-06-15 DIAGNOSIS — I10 Essential (primary) hypertension: Secondary | ICD-10-CM | POA: Diagnosis not present

## 2019-06-15 DIAGNOSIS — R002 Palpitations: Secondary | ICD-10-CM | POA: Diagnosis not present

## 2019-06-15 DIAGNOSIS — R000 Tachycardia, unspecified: Secondary | ICD-10-CM

## 2019-06-15 DIAGNOSIS — Z7189 Other specified counseling: Secondary | ICD-10-CM

## 2019-06-15 NOTE — Patient Instructions (Signed)
Medication Instructions:  Stop: Metoprolol 25 mg daily   *If you need a refill on your cardiac medications before your next appointment, please call your pharmacy*  Lab Work: None  Testing/Procedures: None  Follow-Up: At [facility], you and your health needs are our priority.  As part of our continuing mission to provide you with exceptional heart care, we have created designated Provider Care Teams.  These Care Teams include your primary Cardiologist (physician) and Advanced Practice Providers (APPs -  Physician Assistants and Nurse Practitioners) who all work together to provide you with the care you need, when you need it.  Your next appointment:   1 year(s)  The format for your next appointment:   In Person  Provider:   Buford Dresser, MD

## 2019-06-15 NOTE — Progress Notes (Signed)
Virtual Visit via Video Note   This visit type was conducted due to national recommendations for restrictions regarding the COVID-19 Pandemic (e.g. social distancing) in an effort to limit this patient's exposure and mitigate transmission in our community.  Due to her co-morbid illnesses, this patient is at least at moderate risk for complications without adequate follow up.  This format is felt to be most appropriate for this patient at this time.  All issues noted in this document were discussed and addressed.  A limited physical exam was performed with this format.  Please refer to the patient's chart for her consent to telehealth for [facility].   Date:  06/15/2019   ID:  Lashunda, Rodrigues 02-08-1946, MRN [id number]  Patient Location: Home Provider Location: Home  PCP:  Patient, No Pcp Per  Cardiologist:  Buford Dresser, MD  Electrophysiologist:  None   Evaluation Performed:  Follow-Up Visit  Chief Complaint:  Follow up  History of Present Illness:    CORIAN RUSE is a 74 y.o. female with palpitations, hypertension, obesity.  The patient does not have symptoms concerning for COVID-19 infection (fever, chills, cough, or new shortness of breath).   Today: Recovering post orthopedic surgery 04/2019. Has been a difficult recovery for her. Had an issue when she went to her orthopedic surgeon followup appt that her heart rate was 130 bpm. She felt fine, other than feeling like she does when she has UTIs (which are frequent). She panicked when the office wouldn't even see her because of her heart rate, and she went to urgent care as instructed. At urgent care, they also would not treat her due to her heart rate, and they send her via ambulance to [facility].   She says that even in the ambulance, her heart rate was coming down. Her heart was regular, no palpitations. No chest pain, no shortness of breath. At [facility], her ECG was sinus tach, and she was diagnosed  and treated for UTI (as an outpatient).  Since that time, she monitors her heart rate at home with two different apps. She felt that it was higher than it should be (90s-100s) and started retaking an old metoprolol prescription she had from years ago. She is taking metoprolol tartrate 25 mg once daily, but it makes her feel bad. Her resting heart rate is now in the 80s-90s with it.  We spent significant time today talking about sinus tachycardia. We reviewed that this is almost never a primary problem--sinus tach is a response to something else. Triggers include pain, infection, stress, anxiety, medications and deconditioning. She has dealt with all of these things recently. She is very anxious, though improved since restarting prozac, but she is still terrified of the pandemic and basically doesn't leave her house. She has been sedentary post surgery. After the treatment for her UTI, her heart rate improved as well.  Denies chest pain, shortness of breath at rest or with normal exertion. No PND, orthopnea, LE edema or unexpected weight gain. No syncope or palpitations.   Past Medical History:  Diagnosis Date  . Anxiety   . Arthritis   . Depression    on Prozac for 15 years  . Hemorrhoids   . Hyperlipidemia    intolerant to statins  . Hypertension   . IBS (irritable colon syndrome)   . Menopause   . PONV (postoperative nausea and vomiting)    Past Surgical History:  Procedure Laterality Date  . BREAST ENHANCEMENT SURGERY    .  CESAREAN SECTION    . COLONOSCOPY    . REPLACEMENT TOTAL KNEE     left knee  . TONSILLECTOMY       Current Meds  Medication Sig  . acetaminophen (TYLENOL) 325 MG tablet Take 650 mg by mouth every 6 (six) hours as needed for mild pain.  Marland Kitchen ascorbic acid (VITAMIN C) 500 MG tablet Take 500 mg by mouth daily.  . Cholecalciferol (VITAMIN D3) 125 MCG (5000 UT) CAPS Take by mouth.  . Cyanocobalamin (B-12) 5000 MCG CAPS Take 5,000 mcg by mouth daily.   Marland Kitchen FLUoxetine  (PROZAC) 20 MG tablet Take 20 mg by mouth daily.  Marland Kitchen ibuprofen (ADVIL) 200 MG tablet Take 400 mg by mouth every 6 (six) hours as needed.  . metoprolol tartrate (LOPRESSOR) 25 MG tablet Take 25 mg by mouth daily.  Marland Kitchen zinc gluconate 50 MG tablet Take 50 mg by mouth daily.     Allergies:   Sulfa antibiotics   Social History   Tobacco Use  . Smoking status: Never Smoker  . Smokeless tobacco: Never Used  Substance Use Topics  . Alcohol use: No  . Drug use: No     Family Hx: The patient's family history includes Cancer in her mother; Colon cancer in her mother; Heart disease in her father and mother.  ROS:   Please see the history of present illness.    All other systems reviewed and are negative.   Prior CV studies:   The following studies were reviewed today: No new since last visit  Labs/Other Tests and Data Reviewed:    EKG:  An ECG dated 05/25/19 was personally reviewed today and demonstrated:  sinus tach at 120 bpm  Recent Labs: 05/25/2019: BUN 17; Creatinine, Ser 0.91; Hemoglobin 13.4; Platelets 298; Potassium 4.0; Sodium 140; TSH 1.852   Recent Lipid Panel Lab Results  Component Value Date/Time   CHOL 223 (H) 01/21/2011 11:15 AM   TRIG 90 01/21/2011 11:15 AM   HDL 52 01/21/2011 11:15 AM   CHOLHDL 4.3 01/21/2011 11:15 AM   LDLCALC 153 (H) 01/21/2011 11:15 AM    Wt Readings from Last 3 Encounters:  06/15/19 245 lb (111.1 kg)  05/25/19 240 lb (108.9 kg)  03/16/19 239 lb (108.4 kg)     Objective:    Vital Signs:  Pulse 79   Ht 5\' 5"  (1.651 m)   Wt 245 lb (111.1 kg)   BMI 40.77 kg/m    VITAL SIGNS:  reviewed GEN:  no acute distress EYES:  sclerae anicteric, EOMI - Extraocular Movements Intact RESPIRATORY:  normal respiratory effort, symmetric expansion CARDIOVASCULAR:  no visible JVD SKIN:  no rash, lesions or ulcers. MUSCULOSKELETAL:  no obvious deformities. NEURO:  alert and oriented x 3, no obvious focal deficit PSYCH:  normal affect  ASSESSMENT &  PLAN:    Sinus tachycardia: we discussed this at length today. Spent time talking about how the electrical system works, normal sinus rhythm, and sinus tachycardia. We discussed that sinus tachycardia is almost always secondary to something else. It is completely feasible that if she were acutely infected, this would raise her heart rate. Deconditioning is also a key component. Stress, pain and anxiety can also cause sinus tachycardia -I would stop metoprolol, as treatment for sinus tach is not recommended when it is a secondary event -gradual increase in activity as tolerated, as this will help to lower resting heart rate -discussed that peak heart rate with activity is 220-age, with is 148 bpm -counseled on  what to watch for, red flag signs.  Hypertension: no reading available to me today other than ER, but endorses good control as far as she knows. Encouraged her to use home BP cuff to check readings intermittently. Encouraged her to follow up with her PCP if readings consistently elevated. Not currently on medication.  Primary prevention and CV risk: -recommend heart healthy/Mediterranean diet, with whole grains, fruits, vegetable, fish, lean meats, nuts, and olive oil. Limit salt. -recommend moderate walking, 3-5 times/week for 30-50 minutes each session. Aim for at least 150 minutes.week. Goal should be pace of 3 miles/hours, or walking 1.5 miles in 30 minutes -recommend avoidance of tobacco products. Avoid excess alcohol. -Additional risk factor control:  -Diabetes: no history  -Lipids: no recent, recheck next visit  -Blood pressure control: as above  -Weight: BMI 40. Would benefit from weight loss  COVID-19 Education: The signs and symptoms of COVID-19 were discussed with the patient and how to seek care for testing (follow up with PCP or arrange E-visit).  The importance of social distancing was discussed today.  Time:   Today, I have spent 26 minutes with the patient with telehealth  technology discussing the above problems.     Medication Adjustments/Labs and Tests Ordered: Current medicines are reviewed at length with the patient today.  Concerns regarding medicines are outlined above.   Patient Instructions  Medication Instructions:  Stop: Metoprolol 25 mg daily   *If you need a refill on your cardiac medications before your next appointment, please call your pharmacy*  Lab Work: None  Testing/Procedures: None  Follow-Up: At [facility], you and your health needs are our priority.  As part of our continuing mission to provide you with exceptional heart care, we have created designated Provider Care Teams.  These Care Teams include your primary Cardiologist (physician) and Advanced Practice Providers (APPs -  Physician Assistants and Nurse Practitioners) who all work together to provide you with the care you need, when you need it.  Your next appointment:   1 year(s)  The format for your next appointment:   In Person  Provider:   Buford Dresser, MD      Signed, Buford Dresser, MD  06/15/2019  [place]

## 2019-06-29 ENCOUNTER — Ambulatory Visit: Payer: Medicare Other | Attending: Family Medicine

## 2019-06-29 DIAGNOSIS — Z23 Encounter for immunization: Secondary | ICD-10-CM | POA: Insufficient documentation

## 2019-06-29 NOTE — Progress Notes (Signed)
   Covid-19 Vaccination Clinic  Name:  ALBANIE BRIEF    MRN: [id number] DOB: February 09, 1946  06/29/2019  Ms. Kottler was observed post Covid-19 immunization for 15 minutes without incidence. She was provided with Vaccine Information Sheet and instruction to access the V-Safe system.   Ms. Troupe was instructed to call 911 with any severe reactions post vaccine: Marland Kitchen Difficulty breathing  . Swelling of your face and throat  . A fast heartbeat  . A bad rash all over your body  . Dizziness and weakness    Immunizations Administered    Name Date Dose VIS Date Route   Pfizer COVID-19 Vaccine 06/29/2019  1:41 PM 0.3 mL 05/18/2019 Intramuscular   Manufacturer: [place]   Lot: [id number]   [place]: [id number]

## 2019-07-21 ENCOUNTER — Ambulatory Visit: Payer: Medicare Other | Attending: Internal Medicine

## 2019-07-21 DIAGNOSIS — Z23 Encounter for immunization: Secondary | ICD-10-CM | POA: Insufficient documentation

## 2019-07-21 NOTE — Progress Notes (Signed)
   Covid-19 Vaccination Clinic  Name:  Brenda Reeves    MRN: [id number] DOB: July 11, 1945  07/21/2019  [name] was observed post Covid-19 immunization for 15 minutes without incidence. She was provided with Vaccine Information Sheet and instruction to access the V-Safe system.   [name] was instructed to call 911 with any severe reactions post vaccine: Marland Kitchen Difficulty breathing  . Swelling of your face and throat  . A fast heartbeat  . A bad rash all over your body  . Dizziness and weakness    Immunizations Administered    Name Date Dose VIS Date Route   Pfizer COVID-19 Vaccine 07/21/2019 11:18 AM 0.3 mL 05/18/2019 Intramuscular   Manufacturer: [place]   Lot: [id number]   [place]: [id number]

## 2019-10-30 ENCOUNTER — Telehealth: Payer: Medicare Other | Admitting: Physician Assistant

## 2019-10-30 DIAGNOSIS — R399 Unspecified symptoms and signs involving the genitourinary system: Secondary | ICD-10-CM

## 2019-10-30 MED ORDER — CEPHALEXIN 500 MG PO CAPS: 500.0000 mg | ORAL_CAPSULE | Freq: Two times a day (BID) | ORAL | 0 refills | Status: AC

## 2019-10-30 NOTE — Progress Notes (Signed)

## 2020-01-21 ENCOUNTER — Telehealth: Payer: Medicare Other | Admitting: Emergency Medicine

## 2020-01-21 DIAGNOSIS — N3000 Acute cystitis without hematuria: Secondary | ICD-10-CM

## 2020-01-21 MED ORDER — CEPHALEXIN 500 MG PO CAPS: 500.0000 mg | ORAL_CAPSULE | Freq: Two times a day (BID) | ORAL | 0 refills | Status: AC

## 2020-01-21 NOTE — Progress Notes (Signed)

## 2020-02-05 ENCOUNTER — Other Ambulatory Visit: Payer: Self-pay | Admitting: Adult Health

## 2020-02-05 NOTE — Progress Notes (Signed)
I connected by phone with Brenda Reeves on 02/05/2020 at 8:01 PM to discuss the potential use of a new treatment for mild to moderate COVID-19 viral infection in non-hospitalized patients.  This patient is a 74 y.o. female that meets the FDA criteria for Emergency Use Authorization of COVID monoclonal antibody casirivimab/imdevimab.  Has a (+) direct SARS-CoV-2 viral test result  Has mild or moderate COVID-19   Is NOT hospitalized due to COVID-19  Is within 10 days of symptom onset  Has at least one of the high risk factor(s) for progression to severe COVID-19 and/or hospitalization as defined in EUA.  Specific high risk criteria : Older age (>/= 73 yo)   I have spoken and communicated the following to the patient or parent/caregiver regarding COVID monoclonal antibody treatment:  1. FDA has authorized the emergency use for the treatment of mild to moderate COVID-19 in adults and pediatric patients with positive results of direct SARS-CoV-2 viral testing who are 56 years of age and older weighing at least 40 kg, and who are at high risk for progressing to severe COVID-19 and/or hospitalization.  2. The significant known and potential risks and benefits of COVID monoclonal antibody, and the extent to which such potential risks and benefits are unknown.  3. Information on available alternative treatments and the risks and benefits of those alternatives, including clinical trials.  4. Patients treated with COVID monoclonal antibody should continue to self-isolate and use infection control measures (e.g., wear mask, isolate, social distance, avoid sharing personal items, clean and disinfect "high touch" surfaces, and frequent handwashing) according to CDC guidelines.   5. The patient or parent/caregiver has the option to accept or refuse COVID monoclonal antibody treatment.  After reviewing this information with the patient, The patient agreed to proceed with receiving  casirivimab\imdevimab infusion and will be provided a copy of the Fact sheet prior to receiving the infusion.  Set up 02/07/20 at 1030. Test is in Epic .   Jakel Alphin 02/05/2020 8:01 PM

## 2020-02-07 ENCOUNTER — Ambulatory Visit (HOSPITAL_COMMUNITY)
Admission: RE | Admit: 2020-02-07 | Discharge: 2020-02-07 | Disposition: A | Discharge status: Home / Self Care | Payer: Medicare Other | Source: Ambulatory Visit | Attending: Pulmonary Disease | Admitting: Pulmonary Disease

## 2020-02-07 DIAGNOSIS — Z23 Encounter for immunization: Secondary | ICD-10-CM | POA: Diagnosis not present

## 2020-02-07 DIAGNOSIS — U071 COVID-19: Secondary | ICD-10-CM | POA: Insufficient documentation

## 2020-02-07 MED ORDER — EPINEPHRINE 0.3 MG/0.3ML IJ SOAJ: 0.3000 mg | Freq: Once | INTRAMUSCULAR | Status: DC | PRN

## 2020-02-07 MED ORDER — SODIUM CHLORIDE 0.9 % IV SOLN
1200.0000 mg | Freq: Once | INTRAVENOUS | Status: AC
  Administered 2020-02-07: 1200 mg via INTRAVENOUS
  Filled 2020-02-07: qty 10

## 2020-02-07 MED ORDER — SODIUM CHLORIDE 0.9 % IV SOLN: INTRAVENOUS | Status: DC | PRN

## 2020-02-07 MED ORDER — DIPHENHYDRAMINE HCL 50 MG/ML IJ SOLN: 50.0000 mg | Freq: Once | INTRAMUSCULAR | Status: DC | PRN

## 2020-02-07 MED ORDER — FAMOTIDINE IN NACL 20-0.9 MG/50ML-% IV SOLN: 20.0000 mg | Freq: Once | INTRAVENOUS | Status: DC | PRN

## 2020-02-07 MED ORDER — METHYLPREDNISOLONE SODIUM SUCC 125 MG IJ SOLR: 125.0000 mg | Freq: Once | INTRAMUSCULAR | Status: DC | PRN

## 2020-02-07 MED ORDER — ALBUTEROL SULFATE HFA 108 (90 BASE) MCG/ACT IN AERS: 2.0000 | INHALATION_SPRAY | Freq: Once | RESPIRATORY_TRACT | Status: DC | PRN

## 2020-02-07 NOTE — Progress Notes (Signed)
  Diagnosis: COVID-19  Physician: Dr. Joya Gaskins  Procedure: Covid Infusion Clinic Med: casirivimab\imdevimab infusion - Provided patient with casirivimab\imdevimab fact sheet for patients, parents and caregivers prior to infusion.  Complications: No immediate complications noted.  Discharge: Discharged home   Tia Masker 02/07/2020

## 2020-02-07 NOTE — Discharge Instructions (Signed)

## 2020-06-16 ENCOUNTER — Ambulatory Visit: Payer: Medicare Other | Admitting: Cardiology

## 2020-06-30 ENCOUNTER — Telehealth: Payer: Medicare Other | Admitting: Family

## 2020-06-30 DIAGNOSIS — R399 Unspecified symptoms and signs involving the genitourinary system: Secondary | ICD-10-CM | POA: Diagnosis not present

## 2020-06-30 MED ORDER — CEPHALEXIN 500 MG PO CAPS: 500.0000 mg | ORAL_CAPSULE | Freq: Two times a day (BID) | ORAL | 0 refills | Status: DC

## 2020-06-30 NOTE — Progress Notes (Signed)

## 2020-07-16 ENCOUNTER — Ambulatory Visit: Payer: Medicare Other | Admitting: Cardiology

## 2020-08-04 ENCOUNTER — Telehealth: Payer: Medicare Other | Admitting: Family

## 2020-08-04 DIAGNOSIS — N390 Urinary tract infection, site not specified: Secondary | ICD-10-CM

## 2020-08-04 NOTE — Progress Notes (Signed)
Based on what you shared with me, I feel your condition warrants further evaluation and I recommend that you be seen for a face to face office visit.  Given your symptoms have returned and you have already been treated for a UTI, you need to be seen face to face for a urine culture.    NOTE: If you entered your credit card information for this eVisit, you will not be charged. You may see a "hold" on your card for the $35 but that hold will drop off and you will not have a charge processed.   If you are having a true medical emergency please call 911.      For an urgent face to face visit, [facility] has five urgent care centers for your convenience:     [facility] [facility] [place] Get Driving Directions [phone] [address] [place], [place] [address] . 10 am - 6pm Monday - Friday    [facility]) Get Driving Directions [phone] [address] [place], [place] [address] . 10 am to 8 pm Monday-Friday . 12 pm to 8 pm [facility] Get Driving Directions [phone] [place], [place], [place] [address] . 8 am to 8 pm Monday-Friday . 9 am to 6 pm Saturday . 11 am to 6 pm Sunday     [facility] Get Driving Directions  [phone] [address] [place], [place] [address] . 8 am to 8 pm Monday-Friday . 8 am to 4 pm [facility] at [place] Get Driving Directions [phone] [address] [place], [place] [address] . 12 pm to 6 pm Monday-Friday      Your e-visit answers were reviewed by a board certified advanced clinical practitioner to complete your personal care plan.  Thank you for using e-Visits.

## 2020-09-02 ENCOUNTER — Encounter: Payer: Self-pay | Admitting: Cardiology

## 2020-09-02 ENCOUNTER — Other Ambulatory Visit: Payer: Self-pay

## 2020-09-02 ENCOUNTER — Ambulatory Visit: Payer: Medicare Other | Admitting: Cardiology

## 2020-09-02 VITALS — BP 148/82 | HR 86 | Ht 65.0 in | Wt 246.6 lb

## 2020-09-02 DIAGNOSIS — Z7189 Other specified counseling: Secondary | ICD-10-CM | POA: Diagnosis not present

## 2020-09-02 DIAGNOSIS — R002 Palpitations: Secondary | ICD-10-CM

## 2020-09-02 DIAGNOSIS — R5382 Chronic fatigue, unspecified: Secondary | ICD-10-CM

## 2020-09-02 DIAGNOSIS — I10 Essential (primary) hypertension: Secondary | ICD-10-CM | POA: Diagnosis not present

## 2020-09-02 DIAGNOSIS — Z79899 Other long term (current) drug therapy: Secondary | ICD-10-CM

## 2020-09-02 DIAGNOSIS — Z6841 Body Mass Index (BMI) 40.0 and over, adult: Secondary | ICD-10-CM

## 2020-09-02 LAB — BASIC METABOLIC PANEL
BUN/Creatinine Ratio: 11 — ABNORMAL LOW (ref 12–28)
BUN: 12 mg/dL (ref 8–27)
CO2: 24 mmol/L (ref 20–29)
Calcium: 9.3 mg/dL (ref 8.7–10.3)
Chloride: 100 mmol/L (ref 96–106)
Creatinine, Ser: 1.07 mg/dL — ABNORMAL HIGH (ref 0.57–1.00)
Glucose: 97 mg/dL (ref 65–99)
Potassium: 4.4 mmol/L (ref 3.5–5.2)
Sodium: 139 mmol/L (ref 134–144)
eGFR: 55 mL/min/{1.73_m2} — ABNORMAL LOW (ref >59)

## 2020-09-02 LAB — TSH: TSH: 3.03 u[IU]/mL (ref 0.450–4.500)

## 2020-09-02 LAB — LIPID PANEL
Chol/HDL Ratio: 3.9 ratio (ref 0.0–4.4)
Cholesterol, Total: 192 mg/dL (ref 100–199)
HDL: 49 mg/dL (ref >39)
LDL Chol Calc (NIH): 121 mg/dL — ABNORMAL HIGH (ref 0–99)
Triglycerides: 123 mg/dL (ref 0–149)
VLDL Cholesterol Cal: 22 mg/dL (ref 5–40)

## 2020-09-02 MED ORDER — AMLODIPINE BESYLATE 5 MG PO TABS: 5.0000 mg | ORAL_TABLET | Freq: Every day | ORAL | 3 refills | Status: DC

## 2020-09-02 NOTE — Progress Notes (Signed)
Cardiology Office Note:    Date:  09/02/2020   ID:  Brenda, Reeves 12-13-1945, MRN [id number]  PCP:  Shirlean Mylar, MD  Cardiologist:  Jodelle Red, MD PhD  Referring MD: Shirlean Mylar, MD   CC: follow up  History of Present Illness:    Brenda Reeves is a 75 y.o. female with a hx of palpitations, hypertension, obesity seen for follow up today. I first met her 12/2017 as a new consult at the request of Dr. Constance Goltz for evaluation and management of cardiac risk factors.  CV risk: Never smoker. Tried on simvastatin (she believes) many years ago, had muscle pain. Father died at age 84 for heart disease. Brother has had MIx2. Mother had cardiomyopathy and pacemaker, passed away from cancer at age 48.  Today: Doing ok, under a lot of stress and had a panic attack a few days ago. Felt like she had chills throughout. Has rare palpitations.  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 Drives back and forth to [place] every day, but loves what she does when she is at work. Cannot do work remotely. She is very tired when she gets home. Her friend is an Network engineer, needs the help. However, she still struggles with ambulation after her knee surgeries, has no energy.   Hasn't been checking HR and BP recently at home. Felt poorly on metoprolol before. We reviewed how to check BP at home, what type of cuff to use. We discussed thiazides, ARBs, and amlodipine today. Discussed need to monitor renal function/potassium with both ARB and thiazide. After shared decision making, we will start amlodipine.  We also discussed GLP1RA for weight loss, she would like to hold on this for now.  Denies chest pain, shortness of breath at rest or with normal exertion. No PND, orthopnea, LE edema or unexpected weight gain. No syncope.  Past Medical History:  Diagnosis Date  . Anxiety   . Arthritis   . Depression    on Prozac for 15 years  . Hemorrhoids   . Hyperlipidemia     intolerant to statins  . Hypertension   . IBS (irritable colon syndrome)   . Menopause   . PONV (postoperative nausea and vomiting)     Past Surgical History:  Procedure Laterality Date  . BREAST ENHANCEMENT SURGERY    . CESAREAN SECTION    . COLONOSCOPY    . REPLACEMENT TOTAL KNEE     left knee  . TONSILLECTOMY      Current Medications: Current Meds  Medication Sig  . acetaminophen (TYLENOL) 325 MG tablet Take 650 mg by mouth every 6 (six) hours as needed for mild pain.  Marland Kitchen amLODipine (NORVASC) 5 MG tablet Take 1 tablet (5 mg total) by mouth daily.  Marland Kitchen ascorbic acid (VITAMIN C) 500 MG tablet Take 500 mg by mouth daily.  . Cholecalciferol (VITAMIN D3) 125 MCG (5000 UT) CAPS Take by mouth.  . Cyanocobalamin (B-12) 5000 MCG CAPS Take 5,000 mcg by mouth daily.   Marland Kitchen FLUoxetine (PROZAC) 20 MG tablet Take 20 mg by mouth daily.  Marland Kitchen ibuprofen (ADVIL) 200 MG tablet Take 400 mg by mouth every 6 (six) hours as needed.  . zinc gluconate 50 MG tablet Take 50 mg by mouth daily.  . [DISCONTINUED] cephALEXin (KEFLEX) 500 MG capsule Take 1 capsule (500 mg total) by mouth 2 (two) times daily.     Allergies:   Sulfa antibiotics   Social History   Tobacco  Use  . Smoking status: Never Smoker  . Smokeless tobacco: Never Used  Substance Use Topics  . Alcohol use: No  . Drug use: No    Family History: The patient's family history includes Cancer in her mother; Colon cancer in her mother; Heart disease in her father and mother. Father died at age 56 for heart disease. Brother has had MIx2. Mother had cardiomyopathy and pacemaker, passed away from cancer at age 6.  ROS:   Please see the history of present illness.  Additional pertinent ROS otherwise unremarkable.  EKGs/Labs/Other Studies Reviewed:    The following studies were reviewed today: No prior cardiac studies  EKG:  EKG is ordered today.  The ekg ordered today demonstrates normal sinus rhythm at 86 bpm  Recent Labs: No results  found for requested labs within last 8760 hours.  Recent Lipid Panel    Component Value Date/Time   CHOL 223 (H) 01/21/2011 1115   TRIG 90 01/21/2011 1115   HDL 52 01/21/2011 1115   CHOLHDL 4.3 01/21/2011 1115   VLDL 18 01/21/2011 1115   LDLCALC 153 (H) 01/21/2011 1115    Physical Exam:    VS:  BP (!) 148/82   Pulse 86   Ht 5' 5"  (1.651 m)   Wt 246 lb 9.6 oz (111.9 kg)   SpO2 96%   BMI 41.04 kg/m     Wt Readings from Last 3 Encounters:  09/02/20 246 lb 9.6 oz (111.9 kg)  06/15/19 245 lb (111.1 kg)  05/25/19 240 lb (108.9 kg)    GEN: Well nourished, well developed in no acute distress HEENT: Normal, moist mucous membranes NECK: No JVD CARDIAC: regular rhythm, normal S1 and S2, no rubs or gallops. 1/6 holosystolic murmur. VASCULAR: Radial and DP pulses 2+ bilaterally. No carotid bruits RESPIRATORY:  Clear to auscultation without rales, wheezing or rhonchi  ABDOMEN: Soft, non-tender, non-distended MUSCULOSKELETAL:  Ambulates independently SKIN: Warm and dry, no edema NEUROLOGIC:  Alert and oriented x 3. No focal neuro deficits noted. PSYCHIATRIC:  Normal affect   ASSESSMENT:    1. Essential hypertension   2. Medication management   3. Intermittent palpitations   4. Chronic fatigue   5. Class 3 severe obesity due to excess calories without serious comorbidity with body mass index (BMI) of 40.0 to 44.9 in adult [facility])   6. Cardiac risk counseling   7. Counseling on health promotion and disease prevention    PLAN:    Hypertension: BP improved on recheck but still over goal. Did not do well on beta blocker in the past, believes she was on a medication at one point that made her cough (?ACEi). We discussed thiazide, ARB, and amlodipine today. After shared decision making, will start amlodipine 5 mg daily. Follow up in 4 weeks for BP check. Instructed on home BP cuffs and how to check  Palpitations, fatigue -suspect this is due to recently increased stress, but will check  BMET, TSH today -ECG unremarkable today  Hypercholesterolemia -had muscle aches on simvastatin -recheck lipids today  BMI 41, class 3 severe obesity: -discussed GLP1RA for weight loss today, she would like to think about it -limited activity due to knee arthritis with prior surgeries  Primary prevention of cardiovascular disease -recommend heart healthy/Mediterranean diet, with whole grains, fruits, vegetable, fish, lean meats, nuts, and olive oil. Limit salt. -recommend moderate walking, 3-5 times/week for 30-50 minutes each session. Aim for at least 150 minutes.week. Goal should be pace of 3 miles/hours, or walking 1.5 miles  in 30 minutes -recommend avoidance of tobacco products. Avoid excess alcohol. -ASCVD risk score: The ASCVD Risk score Mikey Bussing DC Jr., et al., 2013) failed to calculate for the following reasons:   Cannot find a previous HDL lab   Cannot find a previous total cholesterol lab   Follow up in 4 weeks to recheck BP  Medication Adjustments/Labs and Tests Ordered: Current medicines are reviewed at length with the patient today.  Concerns regarding medicines are outlined above.  Orders Placed This Encounter  Procedures  . Basic metabolic panel  . Lipid panel  . TSH  . EKG 12-Lead   Meds ordered this encounter  Medications  . amLODipine (NORVASC) 5 MG tablet    Sig: Take 1 tablet (5 mg total) by mouth daily.    Dispense:  90 tablet    Refill:  3    Patient Instructions  Medication Instructions:  Start amlodipine 5 mg daily  *If you need a refill on your cardiac medications before your next appointment, please call your pharmacy*   Lab Work: Your physician recommends lab work today (BMP, Lipid, TSH).  If you have labs (blood work) drawn today and your tests are completely normal, you will receive your results only by: Marland Kitchen MyChart Message (if you have MyChart) OR . A paper copy in the mail If you have any lab test that is abnormal or we need to change your  treatment, we will call you to review the results.   Testing/Procedures: None   Follow-Up: At [facility], you and your health needs are our priority.  As part of our continuing mission to provide you with exceptional heart care, we have created designated Provider Care Teams.  These Care Teams include your primary Cardiologist (physician) and Advanced Practice Providers (APPs -  Physician Assistants and Nurse Practitioners) who all work together to provide you with the care you need, when you need it.  We recommend signing up for the patient portal called "MyChart".  Sign up information is provided on this After Visit Summary.  MyChart is used to connect with patients for Virtual Visits (Telemedicine).  Patients are able to view lab/test results, encounter notes, upcoming appointments, etc.  Non-urgent messages can be sent to your provider as well.   To learn more about what you can do with MyChart, go to [email or web address].    Your next appointment:   4 week(s)  The format for your next appointment:   In Person  Provider:    Dr. Harrell Gave, Sherian Rein D, or APP     Other Instructions -how to check blood pressure:  -sit comfortably in a chair, feet uncrossed and flat on floor, for 5-10 minutes  -arm ideally should rest at the level of the heart. However, arm should be relaxed and not tense (for example, do not hold the arm up unsupported)  -avoid exercise, caffeine, and tobacco for at least 30 minutes prior to BP reading  -don't take BP cuff reading over clothes (always place on skin directly)  -I prefer to know how well the medication is working, so I would like you to take your readings 1-2 hours after taking your blood pressure medication if possible  I prefer arm blood pressure cuffs over wrist cuffs. Omron or similar is a good brand.     Signed, Buford Dresser, MD PhD, [facility] 09/02/2020 10:06 AM    [place]

## 2020-09-02 NOTE — Patient Instructions (Addendum)
Medication Instructions:  Start amlodipine 5 mg daily  *If you need a refill on your cardiac medications before your next appointment, please call your pharmacy*   Lab Work: Your physician recommends lab work today (BMP, Lipid, TSH).  If you have labs (blood work) drawn today and your tests are completely normal, you will receive your results only by: Marland Kitchen MyChart Message (if you have MyChart) OR . A paper copy in the mail If you have any lab test that is abnormal or we need to change your treatment, we will call you to review the results.   Testing/Procedures: None   Follow-Up: At [facility], you and your health needs are our priority.  As part of our continuing mission to provide you with exceptional heart care, we have created designated Provider Care Teams.  These Care Teams include your primary Cardiologist (physician) and Advanced Practice Providers (APPs -  Physician Assistants and Nurse Practitioners) who all work together to provide you with the care you need, when you need it.  We recommend signing up for the patient portal called "MyChart".  Sign up information is provided on this After Visit Summary.  MyChart is used to connect with patients for Virtual Visits (Telemedicine).  Patients are able to view lab/test results, encounter notes, upcoming appointments, etc.  Non-urgent messages can be sent to your provider as well.   To learn more about what you can do with MyChart, go to [email or web address].    Your next appointment:   4 week(s)  The format for your next appointment:   In Person  Provider:    Dr. Harrell Gave, Sherian Rein D, or APP     Other Instructions -how to check blood pressure:  -sit comfortably in a chair, feet uncrossed and flat on floor, for 5-10 minutes  -arm ideally should rest at the level of the heart. However, arm should be relaxed and not tense (for example, do not hold the arm up unsupported)  -avoid exercise, caffeine, and tobacco for at  least 30 minutes prior to BP reading  -don't take BP cuff reading over clothes (always place on skin directly)  -I prefer to know how well the medication is working, so I would like you to take your readings 1-2 hours after taking your blood pressure medication if possible  I prefer arm blood pressure cuffs over wrist cuffs. Omron or similar is a good brand.

## 2020-09-19 ENCOUNTER — Encounter: Payer: Self-pay | Admitting: Cardiology

## 2020-10-03 ENCOUNTER — Ambulatory Visit: Payer: Medicare Other | Admitting: Cardiology

## 2020-10-25 ENCOUNTER — Telehealth: Payer: Medicare Other | Admitting: Physician Assistant

## 2020-10-25 DIAGNOSIS — R3989 Other symptoms and signs involving the genitourinary system: Secondary | ICD-10-CM | POA: Diagnosis not present

## 2020-10-25 MED ORDER — CEPHALEXIN 500 MG PO CAPS: 500.0000 mg | ORAL_CAPSULE | Freq: Two times a day (BID) | ORAL | 0 refills | Status: DC

## 2020-10-25 NOTE — Progress Notes (Signed)
We are sorry that you are not feeling well.  Here is how we plan to help!  Based on what you shared with me it looks like you most likely have a simple urinary tract infection.  A UTI (Urinary Tract Infection) is a bacterial infection of the bladder.  Most cases of urinary tract infections are simple to treat but a key part of your care is to encourage you to drink plenty of fluids and watch your symptoms carefully.  I have prescribed Keflex 500 mg twice a day for 7 days.  Your symptoms should gradually improve. Call us if the burning in your urine worsens, you develop worsening fever, back pain or pelvic pain or if your symptoms do not resolve after completing the antibiotic.  Urinary tract infections can be prevented by drinking plenty of water to keep your body hydrated.  Also be sure when you wipe, wipe from front to back and don't hold it in!  If possible, empty your bladder every 4 hours.  Your e-visit answers were reviewed by a board certified advanced clinical practitioner to complete your personal care plan.  Depending on the condition, your plan could have included both over the counter or prescription medications.  If there is a problem please reply  once you have received a response from your provider.  Your safety is important to [place].  If you have drug allergies check your prescription carefully.    You can use MyChart to ask questions about today's visit, request a non-urgent call back, or ask for a work or school excuse for 24 hours related to this e-Visit. If it has been greater than 24 hours you will need to follow up with your provider, or enter a new e-Visit to address those concerns.   You will get an e-mail in the next two days asking about your experience.  I hope that your e-visit has been valuable and will speed your recovery. Thank you for using e-visits.  I provided 5 minutes of non face-to-face time during this encounter for chart review and documentation.

## 2020-10-27 NOTE — Progress Notes (Deleted)
Cardiology Office Note:    Date:  10/27/2020   ID:  Brenda, Reeves 1945-08-01, MRN [id number]  PCP:  Maurice Small, MD  Cardiologist:  Buford Dresser, MD  Electrophysiologist:  None   Referring MD: Maurice Small, MD   Chief Complaint: follow-up of hypertension  History of Present Illness:    Brenda Reeves is a 75 y.o. female with a history of palpitations, hypertension, hyperlipidemia intolerant to statins, IBS, anxiety/depression, and morbid obesity who is followed by Dr. Harrell Gave and presents for follow-up of hypertension.  Patient was initially referred to Dr. Harrell Gave in 12/2017 for evaluation of management of cardiac risk factors at which time she reported rare palpitations and was overall doing well. She was on Metoprolol which was started about 20 years prior due to "fast heart rate." She noted feeling very tired on this so PCP had already instructed to to stop this and she was in the process of weaning off of it. She was last seen by Dr. Harrell Gave in 08/2020 at which time she reported being under a lot stress and recently had a panic attack but was doing well from a cardiac standpoint. Her BP was mildly elevated at that visit. She had not been regularly checking her BP or heart rate at home. She was started on Amlodipine. GLP1RA was also recommended for weight loss but patient wanted to hold off on that.  Patient presents today for follow-up. ***  Hypertension - *** - Continue Amlodipine 5mg  daily.  Palpitations - Rare. Well controlled. *** - Consider monitor if they become more frequent.  Hyperlipidemia - Lipid panel in 08/2020: Total Cholesterol 192, Triglycerides 123, HDL 49, LDL 121.  - ASCVD 10 year risk is 22.3%; therefore, moderate to high-intensity statin recommended. Intolerant to Simvastatin in the past due to myalgias. ***  Morbid Obesity - BMI ***  Past Medical History:  Diagnosis Date  . Anxiety   . Arthritis   . Depression    on  Prozac for 15 years  . Hemorrhoids   . Hyperlipidemia    intolerant to statins  . Hypertension   . IBS (irritable colon syndrome)   . Menopause   . PONV (postoperative nausea and vomiting)     Past Surgical History:  Procedure Laterality Date  . BREAST ENHANCEMENT SURGERY    . CESAREAN SECTION    . COLONOSCOPY    . REPLACEMENT TOTAL KNEE     left knee  . TONSILLECTOMY      Current Medications: No outpatient medications have been marked as taking for the 11/04/20 encounter (Appointment) with Darreld Mclean, PA-C.     Allergies:   Sulfa antibiotics   Social History   Socioeconomic History  . Marital status: Married    Spouse name: Not on file  . Number of children: 2  . Years of education: Not on file  . Highest education level: Not on file  Occupational History  . 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 Smoking status: Never Smoker  . Smokeless tobacco: Never Used  Substance and Sexual Activity  . Alcohol use: No  . Drug use: No  . Sexual activity: Not Currently  Other Topics Concern  . Not on file  Social History Narrative  . Not on file   Social Determinants of Health   Financial Resource Strain: Not on file  Food Insecurity: Not on file  Transportation Needs: Not on file  Physical Activity: Not  on file  Stress: Not on file  Social Connections: Not on file     Family History: The patient's family history includes Cancer in her mother; Colon cancer in her mother; Heart disease in her father and mother.  ROS:   Please see the history of present illness.     EKGs/Labs/Other Studies Reviewed:    The following studies were reviewed today:  None.  EKG:  EKG not ordered today.   Recent Labs: 09/02/2020: BUN 12; Creatinine, Ser 1.07; Potassium 4.4; Sodium 139; TSH 3.030  Recent Lipid Panel    Component Value Date/Time   CHOL 192 09/02/2020 1024   TRIG 123 09/02/2020 1024   HDL 49 09/02/2020 1024    CHOLHDL 3.9 09/02/2020 1024   CHOLHDL 4.3 01/21/2011 1115   VLDL 18 01/21/2011 1115   LDLCALC 121 (H) 09/02/2020 1024    Physical Exam:    Vital Signs: There were no vitals taken for this visit.    Wt Readings from Last 3 Encounters:  09/02/20 246 lb 9.6 oz (111.9 kg)  06/15/19 245 lb (111.1 kg)  05/25/19 240 lb (108.9 kg)     General: 75 y.o. female in no acute distress. HEENT: Normocephalic and atraumatic. Sclera clear. EOMs intact. Neck: Supple. No carotid bruits. No JVD. Heart: *** RRR. Distinct S1 and S2. No murmurs, gallops, or rubs. Radial and distal pedal pulses 2+ and equal bilaterally. Lungs: No increased work of breathing. Clear to ausculation bilaterally. No wheezes, rhonchi, or rales.  Abdomen: Soft, non-distended, and non-tender to palpation. Bowel sounds present in all 4 quadrants.  MSK: Normal strength and tone for age. *** Extremities: No lower extremity edema.    Skin: Warm and dry. Neuro: Alert and oriented x3. No focal deficits. Psych: Normal affect. Responds appropriately.   Assessment:    No diagnosis found.  Plan:     Disposition: Follow up in ***   Medication Adjustments/Labs and Tests Ordered: Current medicines are reviewed at length with the patient today.  Concerns regarding medicines are outlined above.  No orders of the defined types were placed in this encounter.  No orders of the defined types were placed in this encounter.   There are no Patient Instructions on file for this visit.   Signed, Darreld Mclean, PA-C  10/27/2020 11:47 AM    [facility]

## 2020-11-04 ENCOUNTER — Ambulatory Visit: Payer: Medicare Other | Admitting: Student

## 2020-11-06 ENCOUNTER — Ambulatory Visit: Payer: Medicare Other | Admitting: Cardiology

## 2020-11-06 ENCOUNTER — Other Ambulatory Visit: Payer: Self-pay

## 2020-11-06 ENCOUNTER — Encounter: Payer: Self-pay | Admitting: Cardiology

## 2020-11-06 VITALS — BP 126/72 | HR 88 | Ht 65.0 in | Wt 247.0 lb

## 2020-11-06 DIAGNOSIS — I10 Essential (primary) hypertension: Secondary | ICD-10-CM | POA: Diagnosis not present

## 2020-11-06 DIAGNOSIS — Z7189 Other specified counseling: Secondary | ICD-10-CM

## 2020-11-06 DIAGNOSIS — Z79899 Other long term (current) drug therapy: Secondary | ICD-10-CM

## 2020-11-06 DIAGNOSIS — R5382 Chronic fatigue, unspecified: Secondary | ICD-10-CM

## 2020-11-06 DIAGNOSIS — Z6841 Body Mass Index (BMI) 40.0 and over, adult: Secondary | ICD-10-CM

## 2020-11-06 NOTE — Patient Instructions (Addendum)
Medication Instructions:  Your Physician recommend you continue on your current medication as directed.    *If you need a refill on your cardiac medications before your next appointment, please call your pharmacy*   Lab Work: None ordered today  Testing/Procedures: None ordered today   Follow-Up: At [facility], you and your health needs are our priority.  As part of our continuing mission to provide you with exceptional heart care, we have created designated Provider Care Teams.  These Care Teams include your primary Cardiologist (physician) and Advanced Practice Providers (APPs -  Physician Assistants and Nurse Practitioners) who all work together to provide you with the care you need, when you need it.  We recommend signing up for the patient portal called "MyChart".  Sign up information is provided on this After Visit Summary.  MyChart is used to connect with patients for Virtual Visits (Telemedicine).  Patients are able to view lab/test results, encounter notes, upcoming appointments, etc.  Non-urgent messages can be sent to your provider as well.   To learn more about what you can do with MyChart, go to [email or web address].    Your next appointment:   6 month(s) @ [address] [place], [place] [address]   The format for your next appointment:   In Person  Provider:   Buford Dresser, MD   Other Instructions Look for Omron arm cuff for blood pressure.  -how to check blood pressure:  -sit comfortably in a chair, feet uncrossed and flat on floor, for 5-10 minutes  -arm ideally should rest at the level of the heart. However, arm should be relaxed and not tense (for example, do not hold the arm up unsupported)  -avoid exercise, caffeine, and tobacco for at least 30 minutes prior to BP reading  -don't take BP cuff reading over clothes (always place on skin directly)  -I prefer to know how well the medication is working, so I would like you to take your  readings 1-2 hours after taking your blood pressure medication if possible

## 2020-11-06 NOTE — Progress Notes (Signed)
Cardiology Office Note:    Date:  11/06/2020   ID:  Brenda Reeves, Brenda Reeves 01/22/1946, MRN [id number]  PCP:  Brenda Small, MD  Cardiologist:  Brenda Dresser, MD PhD  Referring MD: Brenda Small, MD   CC: follow up  History of Present Illness:    Brenda Reeves is a 75 y.o. female with a hx of palpitations, hypertension, obesity seen for follow up today. I first met her 12/2017 as a new consult at the request of Dr. Coralyn Reeves for evaluation and management of cardiac risk factors.  CV risk: Never smoker. Tried on simvastatin (she believes) many years ago, had muscle pain. Father died at age 6 for heart disease. Brother has had MIx2. Mother had cardiomyopathy and pacemaker, passed away from cancer at age 15.  Today: Doing well, tolerating amlodipine well. Blood pressure well controlled. Still has fatigue, thinking about cutting back on work, discussed. Reviewed how to check blood pressure at home. No LE edema.   Denies chest pain, shortness of breath at rest or with normal exertion. No PND, orthopnea, LE edema or unexpected weight gain. No syncope or palpitations.  Past Medical History:  Diagnosis Date  . Anxiety   . Arthritis   . Depression    on Prozac for 15 years  . Hemorrhoids   . Hyperlipidemia    intolerant to statins  . Hypertension   . IBS (irritable colon syndrome)   . Menopause   . PONV (postoperative nausea and vomiting)     Past Surgical History:  Procedure Laterality Date  . BREAST ENHANCEMENT SURGERY    . CESAREAN SECTION    . COLONOSCOPY    . REPLACEMENT TOTAL KNEE     left knee  . TONSILLECTOMY      Current Medications: Current Meds  Medication Sig  . acetaminophen (TYLENOL) 325 MG tablet Take 650 mg by mouth every 6 (six) hours as needed for mild pain.  Marland Kitchen amLODipine (NORVASC) 5 MG tablet Take 1 tablet (5 mg total) by mouth daily.  Marland Kitchen ascorbic acid (VITAMIN C) 500 MG tablet Take 500 mg by mouth daily.  . Cholecalciferol (VITAMIN D3) 125 MCG  (5000 UT) CAPS Take by mouth.  . Cyanocobalamin (B-12) 5000 MCG CAPS Take 5,000 mcg by mouth daily.   Marland Kitchen FLUoxetine (PROZAC) 40 MG capsule Take 40 mg by mouth daily.  Marland Kitchen ibuprofen (ADVIL) 200 MG tablet Take 400 mg by mouth every 6 (six) hours as needed.  . zinc gluconate 50 MG tablet Take 50 mg by mouth daily.  . [DISCONTINUED] cephALEXin (KEFLEX) 500 MG capsule Take 1 capsule (500 mg total) by mouth 2 (two) times daily.  . [DISCONTINUED] FLUoxetine (PROZAC) 20 MG tablet Take 20 mg by mouth daily.     Allergies:   Sulfa antibiotics   Social History   Tobacco Use  . Smoking status: Never Smoker  . Smokeless tobacco: Never Used  Substance Use Topics  . Alcohol use: No  . Drug use: No    Family History: The patient's family history includes Cancer in her mother; Colon cancer in her mother; Heart disease in her father and mother. Father died at age 68 for heart disease. Brother has had MIx2. Mother had cardiomyopathy and pacemaker, passed away from cancer at age 46.  ROS:   Please see the history of present illness.  Additional pertinent ROS otherwise unremarkable.  EKGs/Labs/Other Studies Reviewed:    The following studies were reviewed today: No prior cardiac studies  EKG:  EKG is  personally reviewed today.  The ekg ordered 09/02/20 demonstrates normal sinus rhythm at 86 bpm  Recent Labs: 09/02/2020: BUN 12; Creatinine, Ser 1.07; Potassium 4.4; Sodium 139; TSH 3.030  Recent Lipid Panel    Component Value Date/Time   CHOL 192 09/02/2020 1024   TRIG 123 09/02/2020 1024   HDL 49 09/02/2020 1024   CHOLHDL 3.9 09/02/2020 1024   CHOLHDL 4.3 01/21/2011 1115   VLDL 18 01/21/2011 1115   LDLCALC 121 (H) 09/02/2020 1024    Physical Exam:    VS:  BP 126/72 (BP Location: Left Arm, Patient Position: Sitting, Cuff Size: Large)   Pulse 88   Ht 5' 5"  (1.651 m)   Wt 247 lb (112 kg)   BMI 41.10 kg/m     Wt Readings from Last 3 Encounters:  11/06/20 247 lb (112 kg)  09/02/20 246 lb  9.6 oz (111.9 kg)  06/15/19 245 lb (111.1 kg)    GEN: Well nourished, well developed in no acute distress HEENT: Normal, moist mucous membranes NECK: No JVD CARDIAC: regular rhythm, normal S1 and S2, no rubs or gallops. 1/6 systolic murmur. VASCULAR: Radial and DP pulses 2+ bilaterally. No carotid bruits RESPIRATORY:  Clear to auscultation without rales, wheezing or rhonchi  ABDOMEN: Soft, non-tender, non-distended MUSCULOSKELETAL:  Ambulates independently SKIN: Warm and dry, no edema NEUROLOGIC:  Alert and oriented x 3. No focal neuro deficits noted. PSYCHIATRIC:  Normal affect   ASSESSMENT:    1. Essential hypertension   2. Medication management   3. Chronic fatigue   4. Class 3 severe obesity due to excess calories without serious comorbidity with body mass index (BMI) of 40.0 to 44.9 in adult [facility])   5. Cardiac risk counseling   6. Counseling on health promotion and disease prevention    PLAN:    Hypertension:  -doing very well on amlodipine 5 mg, continue.  Palpitations, fatigue -palpitations resolved, fatigue still present -labs, ECG unremarkable.  Hypercholesterolemia -had muscle aches on simvastatin -discussed red yeast rice today, she will try  BMI 41, class 3 severe obesity: -discussed GLP1RA for weight loss -limited activity due to knee arthritis with prior surgeries  Family history of heart disease: brother has ICD, follows with [facility], near 83 but has significant history of heart disease.  Primary prevention of cardiovascular disease -recommend heart healthy/Mediterranean diet, with whole grains, fruits, vegetable, fish, lean meats, nuts, and olive oil. Limit salt. -recommend moderate walking, 3-5 times/week for 30-50 minutes each session. Aim for at least 150 minutes.week. Goal should be pace of 3 miles/hours, or walking 1.5 miles in 30 minutes -recommend avoidance of tobacco products. Avoid excess alcohol. -ASCVD risk score: The 10-year ASCVD risk score  Brenda Reeves., et al., 2013) is: 16.7%   Values used to calculate the score:     Age: 75 years     Sex: Female     Is Non-Hispanic African American: No     Diabetic: No     Tobacco smoker: No     Systolic Blood Pressure: 201 mmHg     Is BP treated: Yes     HDL Cholesterol: 49 mg/dL     Total Cholesterol: 192 mg/dL   Follow up: Every 6 mos per patient preference.  Medication Adjustments/Labs and Tests Ordered: Current medicines are reviewed at length with the patient today.  Concerns regarding medicines are outlined above.  No orders of the defined types were placed in this encounter.  No orders of the defined types were placed in  this encounter.   Patient Instructions  Medication Instructions:  Your Physician recommend you continue on your current medication as directed.    *If you need a refill on your cardiac medications before your next appointment, please call your pharmacy*   Lab Work: None ordered today  Testing/Procedures: None ordered today   Follow-Up: At [facility], you and your health needs are our priority.  As part of our continuing mission to provide you with exceptional heart care, we have created designated Provider Care Teams.  These Care Teams include your primary Cardiologist (physician) and Advanced Practice Providers (APPs -  Physician Assistants and Nurse Practitioners) who all work together to provide you with the care you need, when you need it.  We recommend signing up for the patient portal called "MyChart".  Sign up information is provided on this After Visit Summary.  MyChart is used to connect with patients for Virtual Visits (Telemedicine).  Patients are able to view lab/test results, encounter notes, upcoming appointments, etc.  Non-urgent messages can be sent to your provider as well.   To learn more about what you can do with MyChart, go to [email or web address].    Your next appointment:   6 month(s) @ [address] [place]  [place], [place] [address]   The format for your next appointment:   In Person  Provider:   Buford Dresser, MD   Other Instructions Look for Omron arm cuff for blood pressure.  -how to check blood pressure:  -sit comfortably in a chair, feet uncrossed and flat on floor, for 5-10 minutes  -arm ideally should rest at the level of the heart. However, arm should be relaxed and not tense (for example, do not hold the arm up unsupported)  -avoid exercise, caffeine, and tobacco for at least 30 minutes prior to BP reading  -don't take BP cuff reading over clothes (always place on skin directly)  -I prefer to know how well the medication is working, so I would like you to take your readings 1-2 hours after taking your blood pressure medication if possible    Signed, Brenda Dresser, MD PhD, [facility] 11/06/2020 2:54 PM    [place]

## 2020-11-10 ENCOUNTER — Encounter: Payer: Self-pay | Admitting: Cardiology

## 2021-01-04 ENCOUNTER — Telehealth: Payer: Medicare Other | Admitting: Physician Assistant

## 2021-01-04 DIAGNOSIS — R300 Dysuria: Secondary | ICD-10-CM

## 2021-01-04 NOTE — Progress Notes (Signed)
Based on what you shared with me, I feel your condition warrants further evaluation and I recommend that you be seen in a face to face visit.  Ms. Brenda Reeves,  It is advisable that you get a urine culture if we suspect that you may have a UTI. Additionally, you had similar symptoms recurrently in the past 6 months, and it is recommended that you have a face to face visit for further evaluation.   NOTE: There will be NO CHARGE for this eVisit   If you are having a true medical emergency please call 911.      For an urgent face to face visit, [place] has six urgent care centers for your convenience:     [place] [facility] [place] Get Driving Directions [id number] [place], [place] [address]    [place] [facility] [place] [facility]) Get Driving Directions [phone] [place], [place] [address]  [place] [facility] [place]) Get Driving Directions [id number] [address] [place],  [place]  [address]  [place] [facility] Get Driving Directions [id number] [place], [place], [place] [address]   [place] [facility] Get Driving Directions  [id number] [address] [place], [place] [address]   [place] [facility] [place] Get Driving Directions [id number] [address], [place],  [address]  Your MyChart E-visit questionnaire answers were reviewed by a board certified advanced clinical practitioner to complete your personal care plan based on your specific symptoms.  Thank you for using e-Visits.   I spent 5-10 minutes on review and completion of this note- [facility]

## 2021-03-01 ENCOUNTER — Telehealth: Payer: Medicare Other | Admitting: Family

## 2021-03-01 DIAGNOSIS — R399 Unspecified symptoms and signs involving the genitourinary system: Secondary | ICD-10-CM

## 2021-03-01 NOTE — Progress Notes (Signed)
Based on what you shared with me, I feel your condition warrants further evaluation and I recommend that you be seen in a face to face visit.  After reviewing your chart, this is your 5 Evisit this year for UTI symptoms. Given you age and recurrent symptoms, you need to be seen in person for further testing and maybe a referral to a specialists.    NOTE: There will be NO CHARGE for this eVisit   If you are having a true medical emergency please call 911.      For an urgent face to face visit, [facility] has six urgent care centers for your convenience:     [place] [facility] [place] [facility] [place] Get Driving Directions [phone] [place], [place] [address]    [place] [facility] [place] [facility]) Get Driving Directions [phone] [place], [place] [address]  [place] [facility] [place]) Get Driving Directions [phone] [address] [place],  [place]  [address]  [place] [facility] Get Driving Directions [phone] [place], [place], [place] [address]   [place] [facility] Get Driving Directions  [phone] [address] [place], [place] [address]   [place] [facility] [place] Get Driving Directions [phone] [address] [place], [place] [address]  Your MyChart E-visit questionnaire answers were reviewed by a board certified advanced clinical practitioner to complete your personal care plan based on your specific symptoms.  Thank you for using e-Visits.

## 2021-05-08 IMAGING — CT CT ANGIO CHEST
2 of 6 series · 18 of 36 positions shown · IV contrast (omnipaque)
Comparison: None

CLINICAL DATA: Tachycardia. Elevated white count. Elevated D-dimer.

EXAM:
CT ANGIOGRAPHY CHEST WITH CONTRAST
TECHNIQUE: Multidetector CT imaging of the chest was performed using the
standard protocol during bolus administration of intravenous
contrast. Multiplanar CT image reconstructions and MIPs were
obtained to evaluate the vascular anatomy.
CONTRAST:  100mL OMNIPAQUE IOHEXOL 350 MG/ML SOLN

[Series 5: thins · axial · 0.69mm/px · z∈[-305,-70]mm · 17 of 265 slices shown]
[im 15/265  lung]
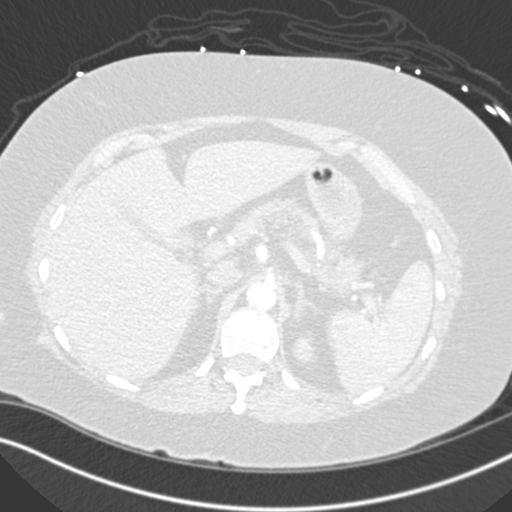
[im 30/265  mediastinal]
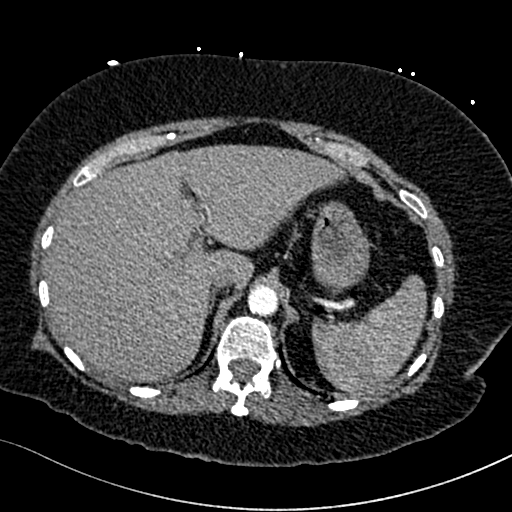
[im 45/265  lung]
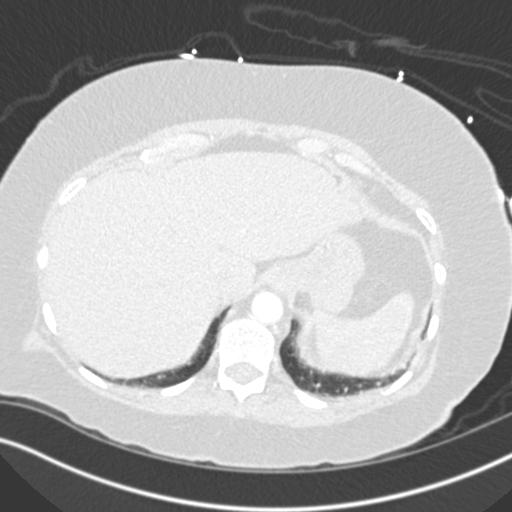
[im 59/265  mediastinal]
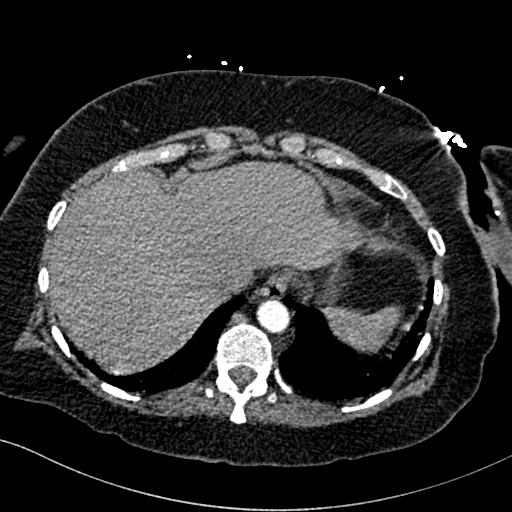
[im 74/265  lung]
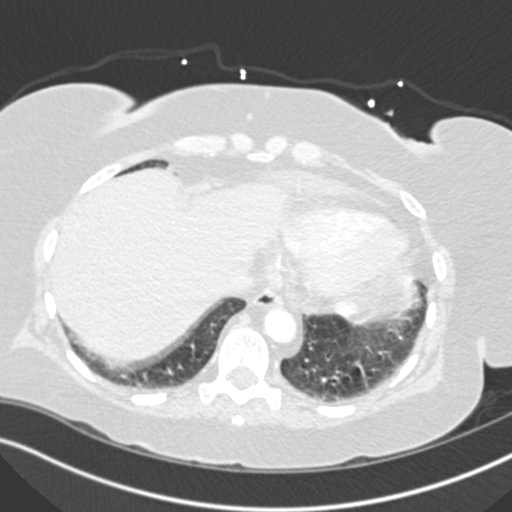
[im 89/265  mediastinal]
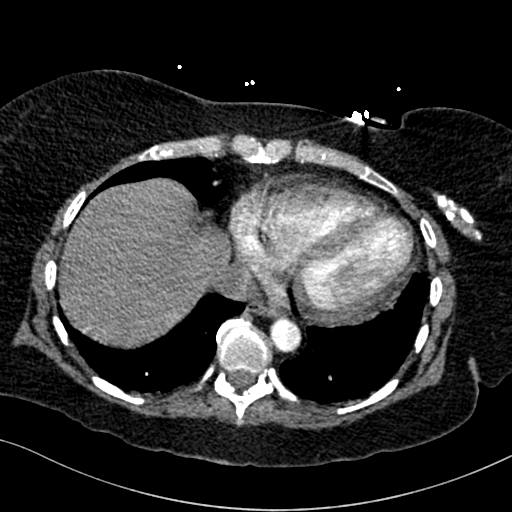
[im 103/265  lung]
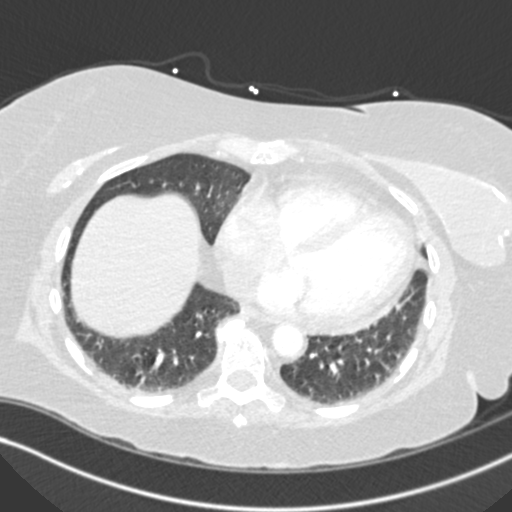
[im 118/265  mediastinal]
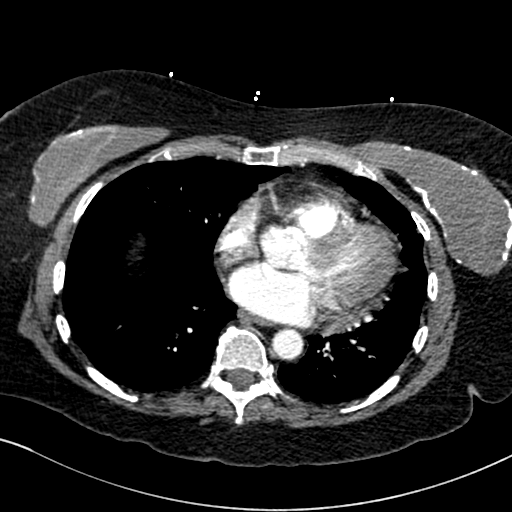
[im 133/265  lung]
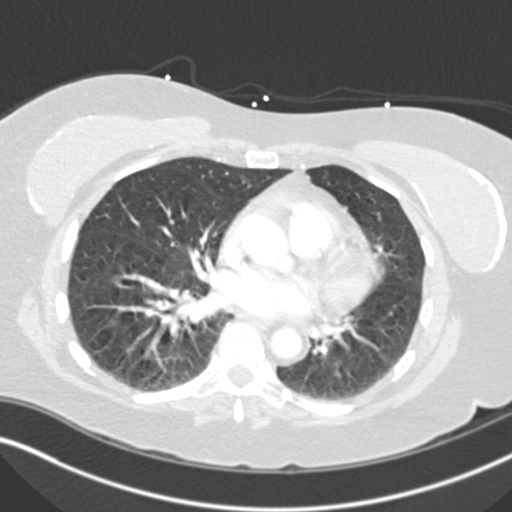
[im 147/265  mediastinal]
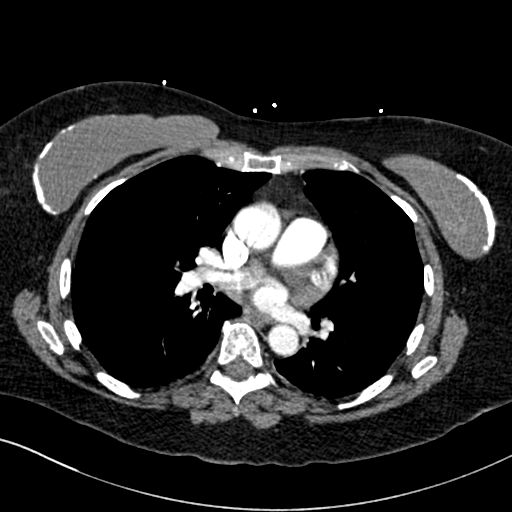
[im 162/265  lung]
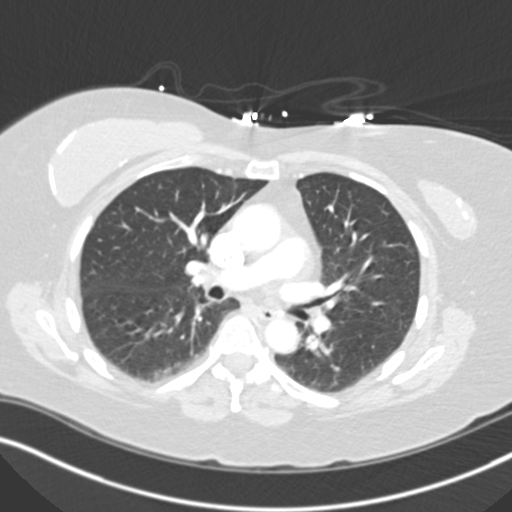
[im 177/265  mediastinal]
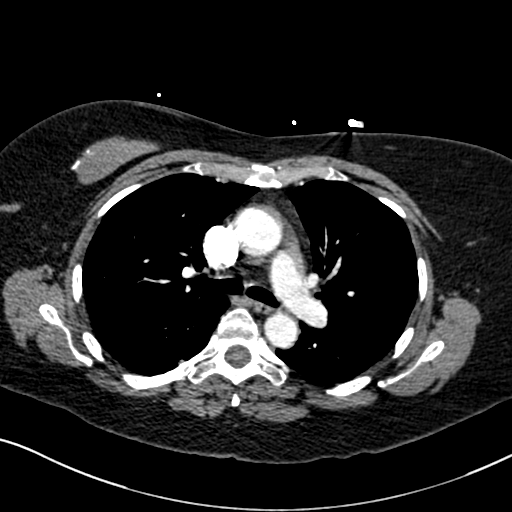
[im 191/265  lung]
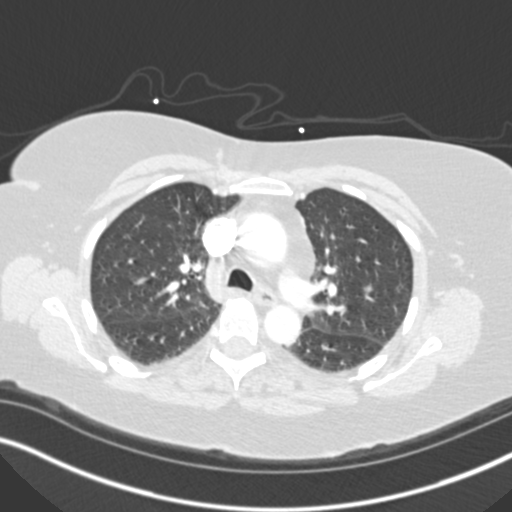
[im 206/265  mediastinal]
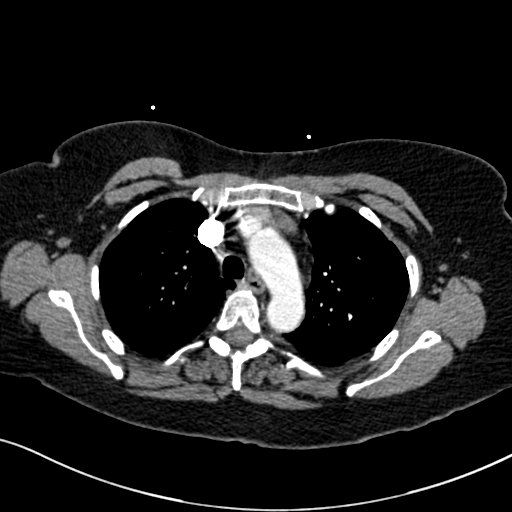
[im 221/265  lung]
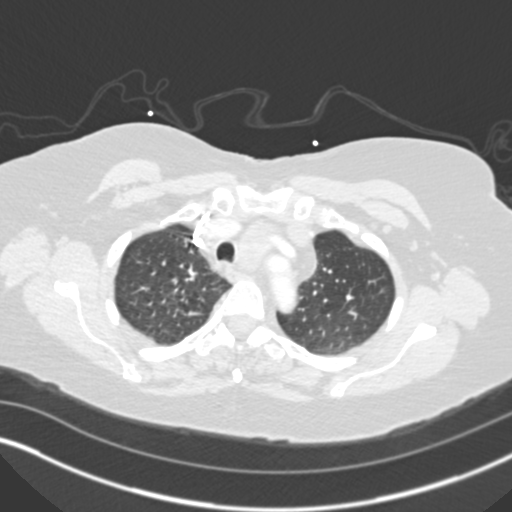
[im 235/265  mediastinal]
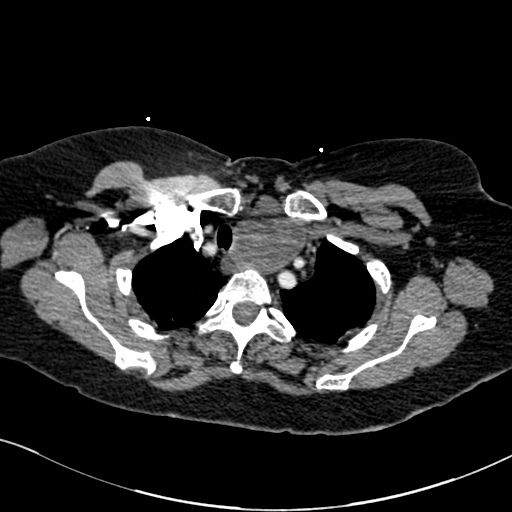
[im 250/265  lung]
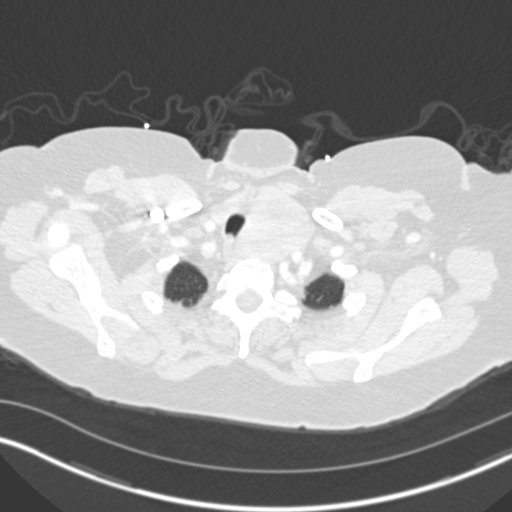

[Series 7: coronal mpr · coronal · 0.53mm/px · 1 of 151 slices shown]
[im 76/151  mediastinal]
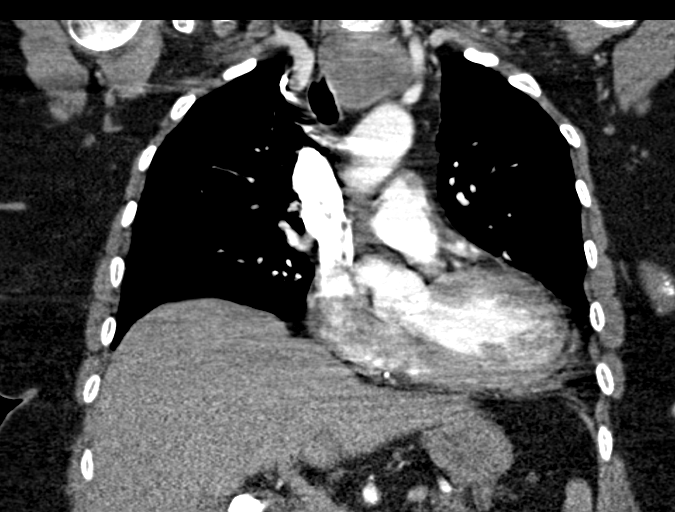

[18 of 36 positions shown; findings below may reference images not displayed]

FINDINGS: Cardiovascular: No filling defects in the pulmonary arteries to
suggest pulmonary emboli. Heart is borderline in size. Small
pericardial effusion. Aorta is normal caliber. Scattered aortic
calcifications.

Mediastinum/Nodes: No mediastinal, hilar, or axillary adenopathy.
Trachea and esophagus are unremarkable. Large heterogeneous solid
mass involving much of the left thyroid lobe measuring up to 6 cm.

Lungs/Pleura: No confluent opacities or pleural effusions.

Upper Abdomen: Imaging into the upper abdomen shows no acute
findings. 1.7 cm gallstone in the gallbladder neck.

Musculoskeletal: Bilateral calcified breast implants. No acute bony
abnormality.

Review of the MIP images confirms the above findings.
IMPRESSION: No evidence of pulmonary embolus.

Borderline heart size.  Small pericardial effusion.

Cholelithiasis.

No acute cardiopulmonary disease.

Aortic Atherosclerosis ([id number]).

## 2021-05-28 ENCOUNTER — Ambulatory Visit (HOSPITAL_BASED_OUTPATIENT_CLINIC_OR_DEPARTMENT_OTHER): Payer: Medicare Other | Admitting: Cardiology

## 2021-06-17 DIAGNOSIS — L400 Psoriasis vulgaris: Secondary | ICD-10-CM | POA: Diagnosis not present

## 2021-06-17 DIAGNOSIS — B078 Other viral warts: Secondary | ICD-10-CM | POA: Diagnosis not present

## 2021-06-17 DIAGNOSIS — C4441 Basal cell carcinoma of skin of scalp and neck: Secondary | ICD-10-CM | POA: Diagnosis not present

## 2021-06-17 DIAGNOSIS — C44519 Basal cell carcinoma of skin of other part of trunk: Secondary | ICD-10-CM | POA: Diagnosis not present

## 2021-06-17 DIAGNOSIS — L570 Actinic keratosis: Secondary | ICD-10-CM | POA: Diagnosis not present

## 2021-06-17 DIAGNOSIS — L814 Other melanin hyperpigmentation: Secondary | ICD-10-CM | POA: Diagnosis not present

## 2021-06-17 DIAGNOSIS — D1801 Hemangioma of skin and subcutaneous tissue: Secondary | ICD-10-CM | POA: Diagnosis not present

## 2021-06-17 DIAGNOSIS — Z85828 Personal history of other malignant neoplasm of skin: Secondary | ICD-10-CM | POA: Diagnosis not present

## 2021-06-17 DIAGNOSIS — L821 Other seborrheic keratosis: Secondary | ICD-10-CM | POA: Diagnosis not present

## 2021-06-22 ENCOUNTER — Encounter (HOSPITAL_BASED_OUTPATIENT_CLINIC_OR_DEPARTMENT_OTHER): Payer: Self-pay | Admitting: Cardiology

## 2021-06-22 ENCOUNTER — Other Ambulatory Visit: Payer: Self-pay

## 2021-06-22 ENCOUNTER — Ambulatory Visit (HOSPITAL_BASED_OUTPATIENT_CLINIC_OR_DEPARTMENT_OTHER): Payer: Medicare Other | Admitting: Cardiology

## 2021-06-22 VITALS — BP 136/74 | HR 104 | Ht 65.0 in | Wt 245.2 lb

## 2021-06-22 DIAGNOSIS — Z7189 Other specified counseling: Secondary | ICD-10-CM

## 2021-06-22 DIAGNOSIS — I10 Essential (primary) hypertension: Secondary | ICD-10-CM

## 2021-06-22 DIAGNOSIS — E7800 Pure hypercholesterolemia, unspecified: Secondary | ICD-10-CM

## 2021-06-22 DIAGNOSIS — Z6841 Body Mass Index (BMI) 40.0 and over, adult: Secondary | ICD-10-CM

## 2021-06-22 NOTE — Patient Instructions (Signed)
Medication Instructions:  Your Physician recommend you continue on your current medication as directed.    *If you need a refill on your cardiac medications before your next appointment, please call your pharmacy*   Lab Work: None ordered today   Testing/Procedures: None ordered today   Follow-Up: At [facility], you and your health needs are our priority.  As part of our continuing mission to provide you with exceptional heart care, we have created designated Provider Care Teams.  These Care Teams include your primary Cardiologist (physician) and Advanced Practice Providers (APPs -  Physician Assistants and Nurse Practitioners) who all work together to provide you with the care you need, when you need it.  We recommend signing up for the patient portal called "MyChart".  Sign up information is provided on this After Visit Summary.  MyChart is used to connect with patients for Virtual Visits (Telemedicine).  Patients are able to view lab/test results, encounter notes, upcoming appointments, etc.  Non-urgent messages can be sent to your provider as well.   To learn more about what you can do with MyChart, go to [email or web address].    Your next appointment:   6 month(s)  The format for your next appointment:   In Person  Provider:   Buford Dresser, MD    I like the Omron arm blood pressure cuffs, but there are other good brands too. I prefer the arm cuff to the wrist cuff (arm is more accurate).  -how to check blood pressure:  -sit comfortably in a chair, feet uncrossed and flat on floor, for 5-10 minutes  -arm ideally should rest at the level of the heart. However, arm should be relaxed and not tense (for example, do not hold the arm up unsupported)  -avoid exercise, caffeine, and tobacco for at least 30 minutes prior to BP reading  -don't take BP cuff reading over clothes (always place on skin directly)  -I prefer to know how well the medication is working, so I  would like you to take your readings 1-2 hours after taking your blood pressure medication if possible

## 2021-06-22 NOTE — Progress Notes (Signed)
Cardiology Office Note:    Date:  06/22/2021   ID:  Brenda Reeves, Brenda Reeves Sep 30, 1945, MRN [id number]  PCP:  Maurice Small, MD  Cardiologist:  Buford Dresser, MD PhD  Referring MD: Maurice Small, MD   CC: follow up  History of Present Illness:    Brenda Reeves is a 76 y.o. female with a hx of palpitations, hypertension, obesity seen for follow up today. I first met her 12/2017 as a new consult at the request of Dr. Coralyn Mark for evaluation and management of cardiac risk factors.  CV risk: Never smoker. Tried on simvastatin (she believes) many years ago, had muscle pain. Father died at age 48 for heart disease. Brother has had MIx2. Mother had cardiomyopathy and pacemaker, passed away from cancer at age 32.  Today: Overall, she is feeling fine. Lately she is not monitoring her blood pressure at home.   She reports that at baseline she has a little psoriasis on her left LE, but this worsened after starting her clonazepam and fluoxetine. She followed up with dermatology and received medication. Her psoriasis seems to be improving at this time.  We discussed at length the effectiveness and side effects of COVID boosters. She also asks for recommendations with finding a new PCP, and we will be happy to assist.  She denies any palpitations, chest pain, or shortness of breath. No lightheadedness, headaches, syncope, orthopnea, PND, lower extremity edema or exertional symptoms.   Past Medical History:  Diagnosis Date   Anxiety    Arthritis    Depression    on Prozac for 15 years   Hemorrhoids    Hyperlipidemia    intolerant to statins   Hypertension    IBS (irritable colon syndrome)    Menopause    PONV (postoperative nausea and vomiting)     Past Surgical History:  Procedure Laterality Date   BREAST ENHANCEMENT SURGERY     CESAREAN SECTION     COLONOSCOPY     REPLACEMENT TOTAL KNEE     left knee   TONSILLECTOMY      Current Medications: Current Meds   Medication Sig   acetaminophen (TYLENOL) 325 MG tablet Take 650 mg by mouth every 6 (six) hours as needed for mild pain.   amLODipine (NORVASC) 5 MG tablet Take 1 tablet (5 mg total) by mouth daily.   Cholecalciferol (VITAMIN D3) 125 MCG (5000 UT) CAPS Take by mouth.   clonazePAM (KLONOPIN) 1 MG tablet Take 1 mg by mouth as needed.   FLUoxetine (PROZAC) 40 MG capsule Take 40 mg by mouth daily.   ibuprofen (ADVIL) 200 MG tablet Take 400 mg by mouth every 6 (six) hours as needed.     Allergies:   Sulfa antibiotics   Social History   Tobacco Use   Smoking status: Never   Smokeless tobacco: Never  Substance Use Topics   Alcohol use: No   Drug use: No    Family History: The patient's family history includes Cancer in her mother; Colon cancer in her mother; Heart disease in her father and mother. Father died at age 51 for heart disease. Brother has had MIx2. Mother had cardiomyopathy and pacemaker, passed away from cancer at age 3.  ROS:   Please see the history of present illness.   (+) Psoriasis Additional pertinent ROS otherwise unremarkable.  EKGs/Labs/Other Studies Reviewed:    The following studies were reviewed today:  CT-A Chest 05/25/2019: FINDINGS: Cardiovascular: No filling defects in the pulmonary arteries to suggest pulmonary  emboli. Heart is borderline in size. Small pericardial effusion. Aorta is normal caliber. Scattered aortic calcifications.   Mediastinum/Nodes: No mediastinal, hilar, or axillary adenopathy. Trachea and esophagus are unremarkable. Large heterogeneous solid mass involving much of the left thyroid lobe measuring up to 6 cm.   Lungs/Pleura: No confluent opacities or pleural effusions.   Upper Abdomen: Imaging into the upper abdomen shows no acute findings. 1.7 cm gallstone in the gallbladder neck.   Musculoskeletal: Bilateral calcified breast implants. No acute bony abnormality.   Review of the MIP images confirms the above findings.    IMPRESSION: No evidence of pulmonary embolus.   Borderline heart size.  Small pericardial effusion.   Cholelithiasis.   No acute cardiopulmonary disease.   Aortic Atherosclerosis (ICD10-I70.0).  EKG:  EKG is personally reviewed today.   06/22/2021: sinus tachycardia at 104 bpm with rare PAC 09/02/20: normal sinus rhythm at 86 bpm  Recent Labs: 09/02/2020: BUN 12; Creatinine, Ser 1.07; Potassium 4.4; Sodium 139; TSH 3.030   Recent Lipid Panel    Component Value Date/Time   CHOL 192 09/02/2020 1024   TRIG 123 09/02/2020 1024   HDL 49 09/02/2020 1024   CHOLHDL 3.9 09/02/2020 1024   CHOLHDL 4.3 01/21/2011 1115   VLDL 18 01/21/2011 1115   LDLCALC 121 (H) 09/02/2020 1024    Physical Exam:    VS:  BP 136/74 (BP Location: Right Arm, Patient Position: Sitting, Cuff Size: Large)    Pulse (!) 104    Ht _0  (1.651 m)    Wt 245 lb 3.2 oz (111.2 kg)    BMI 40.80 kg/m     Wt Readings from Last 3 Encounters:  06/22/21 245 lb 3.2 oz (111.2 kg)  11/06/20 247 lb (112 kg)  09/02/20 246 lb 9.6 oz (111.9 kg)    GEN: Well nourished, well developed in no acute distress HEENT: Normal, moist mucous membranes NECK: No JVD CARDIAC: regular rhythm, rare dropped beat,normal S1 and S2, no rubs or gallops. 1/6 systolic murmur. VASCULAR: Radial and DP pulses 2+ bilaterally. No carotid bruits RESPIRATORY:  Clear to auscultation without rales, wheezing or rhonchi  ABDOMEN: Soft, non-tender, non-distended MUSCULOSKELETAL:  Ambulates independently SKIN: Warm and dry, no edema. Bilateral LE psoriasis. NEUROLOGIC:  Alert and oriented x 3. No focal neuro deficits noted. PSYCHIATRIC:  Normal affect   ASSESSMENT:    1. Essential hypertension   2. Class 3 severe obesity due to excess calories without serious comorbidity with body mass index (BMI) of 40.0 to 44.9 in adult ([place])   3. Pure hypercholesterolemia   4. Cardiac risk counseling   5. Counseling on health promotion and disease prevention      PLAN:    Hypertension:  -continue amlodipine 5 mg daily -slightly above goal, discussed home monitoring again today  Palpitations -palpitations resolved -labs, ECG unremarkable.  Hypercholesterolemia -had muscle aches on simvastatin -declines additional trial of statin  BMI ~41, class 3 severe obesity: -we have discussed GLP1RA for weight loss in the past -limited activity due to knee arthritis with prior surgeries  Family history of heart disease: brother has ICD, follows with [facility], near 34 but has significant history of heart disease.  Primary prevention of cardiovascular disease -recommend heart healthy/Mediterranean diet, with whole grains, fruits, vegetable, fish, lean meats, nuts, and olive oil. Limit salt. -recommend moderate walking, 3-5 times/week for 30-50 minutes each session. Aim for at least 150 minutes.week. Goal should be pace of 3 miles/hours, or walking 1.5 miles in 30 minutes -recommend avoidance  of tobacco products. Avoid excess alcohol. -ASCVD risk score: The 10-year ASCVD risk score (Arnett DK, et al., 2019) is: 21.2%   Values used to calculate the score:     Age: 54 years     Sex: Female     Is Non-Hispanic African American: No     Diabetic: No     Tobacco smoker: No     Systolic Blood Pressure: 779 mmHg     Is BP treated: Yes     HDL Cholesterol: 49 mg/dL     Total Cholesterol: 192 mg/dL   Follow up: Every 6 mos per patient preference.  Medication Adjustments/Labs and Tests Ordered: Current medicines are reviewed at length with the patient today.  Concerns regarding medicines are outlined above.   Orders Placed This Encounter  Procedures   EKG 12-Lead   No orders of the defined types were placed in this encounter.  Patient Instructions  Medication Instructions:  Your Physician recommend you continue on your current medication as directed.    *If you need a refill on your cardiac medications before your next appointment, please call your  pharmacy*   Lab Work: None ordered today   Testing/Procedures: None ordered today   Follow-Up: At [facility], you and your health needs are our priority.  As part of our continuing mission to provide you with exceptional heart care, we have created designated Provider Care Teams.  These Care Teams include your primary Cardiologist (physician) and Advanced Practice Providers (APPs -  Physician Assistants and Nurse Practitioners) who all work together to provide you with the care you need, when you need it.  We recommend signing up for the patient portal called "MyChart".  Sign up information is provided on this After Visit Summary.  MyChart is used to connect with patients for Virtual Visits (Telemedicine).  Patients are able to view lab/test results, encounter notes, upcoming appointments, etc.  Non-urgent messages can be sent to your provider as well.   To learn more about what you can do with MyChart, go to [email or web address].    Your next appointment:   6 month(s)  The format for your next appointment:   In Person  Provider:   Buford Dresser, MD    I like the [employer] arm blood pressure cuffs, but there are other good brands too. I prefer the arm cuff to the wrist cuff (arm is more accurate).  -how to check blood pressure:  -sit comfortably in a chair, feet uncrossed and flat on floor, for 5-10 minutes  -arm ideally should rest at the level of the heart. However, arm should be relaxed and not tense (for example, do not hold the arm up unsupported)  -avoid exercise, caffeine, and tobacco for at least 30 minutes prior to BP reading  -don't take BP cuff reading over clothes (always place on skin directly)  -I prefer to know how well the medication is working, so I would like you to take your readings 1-2 hours after taking your blood pressure medication if possible    I,Mathew Stumpf,acting as a scribe for [employer], MD.,have documented all relevant  documentation on the behalf of Buford Dresser, MD,as directed by  Buford Dresser, MD while in the presence of Buford Dresser, MD.  I, Buford Dresser, MD, have reviewed all documentation for this visit. The documentation on 06/22/21 for the exam, diagnosis, procedures, and orders are all accurate and complete.   Signed, Buford Dresser, MD PhD, [facility] 06/22/2021 10:53 AM    [facility]

## 2021-07-14 DIAGNOSIS — I10 Essential (primary) hypertension: Secondary | ICD-10-CM | POA: Diagnosis not present

## 2021-07-14 DIAGNOSIS — E785 Hyperlipidemia, unspecified: Secondary | ICD-10-CM | POA: Diagnosis not present

## 2021-07-14 DIAGNOSIS — R739 Hyperglycemia, unspecified: Secondary | ICD-10-CM | POA: Diagnosis not present

## 2021-07-16 DIAGNOSIS — R739 Hyperglycemia, unspecified: Secondary | ICD-10-CM | POA: Diagnosis not present

## 2021-07-16 DIAGNOSIS — E785 Hyperlipidemia, unspecified: Secondary | ICD-10-CM | POA: Diagnosis not present

## 2021-07-16 DIAGNOSIS — G4700 Insomnia, unspecified: Secondary | ICD-10-CM | POA: Diagnosis not present

## 2021-07-16 DIAGNOSIS — Z0000 Encounter for general adult medical examination without abnormal findings: Secondary | ICD-10-CM | POA: Diagnosis not present

## 2021-07-16 DIAGNOSIS — I10 Essential (primary) hypertension: Secondary | ICD-10-CM | POA: Diagnosis not present

## 2021-07-16 DIAGNOSIS — Z1211 Encounter for screening for malignant neoplasm of colon: Secondary | ICD-10-CM | POA: Diagnosis not present

## 2021-08-10 DIAGNOSIS — H40053 Ocular hypertension, bilateral: Secondary | ICD-10-CM | POA: Diagnosis not present

## 2021-08-10 DIAGNOSIS — H2513 Age-related nuclear cataract, bilateral: Secondary | ICD-10-CM | POA: Diagnosis not present

## 2021-08-25 ENCOUNTER — Other Ambulatory Visit: Payer: Self-pay | Admitting: Cardiology

## 2021-08-25 DIAGNOSIS — I10 Essential (primary) hypertension: Secondary | ICD-10-CM

## 2021-09-13 ENCOUNTER — Telehealth: Payer: Medicare Other | Admitting: Physician Assistant

## 2021-09-13 DIAGNOSIS — R3989 Other symptoms and signs involving the genitourinary system: Secondary | ICD-10-CM | POA: Diagnosis not present

## 2021-09-13 MED ORDER — CEPHALEXIN 500 MG PO CAPS: 500.0000 mg | ORAL_CAPSULE | Freq: Two times a day (BID) | ORAL | 0 refills | Status: DC

## 2021-09-13 NOTE — Progress Notes (Signed)

## 2022-01-27 NOTE — Progress Notes (Signed)
Cardiology Office Note:    Date:  01/29/2022   ID:  Brenda Reeves, Brenda Reeves Aug 20, 1945, MRN [id number]  PCP:  [facility]  Cardiologist:  Buford Dresser, MD PhD  Referring MD: Maurice Small, MD   CC: follow up  History of Present Illness:    Brenda Reeves is a 76 y.o. female with a hx of palpitations, hypertension, obesity, seen for follow up today. I first met her 12/2017 as a new consult at the request of Dr. Coralyn Mark for evaluation and management of cardiac risk factors.  CV risk: Never smoker. Tried on simvastatin (she believes) many years ago, had muscle pain. Father died at age 19 for heart disease. Brother has had MIx2. Mother had cardiomyopathy and pacemaker, passed away from cancer at age 13.  At her last appointment she reported a baseline of mild psoriasis on her left LE, but this worsened after starting her clonazepam and fluoxetine. She followed up with dermatology and received medication. Her psoriasis seemed to be improving. We discussed at length the effectiveness and side effects of COVID boosters. She also asked for recommendations with finding a new PCP, and we were happy to assist.  Today: Her main concern today is intermittent episodes of significant lightheadedness when she first gets up in the morning. She notes these episodes do not consist of a dizzy sensation (she is familiar with prior vertigo), only lightheadedness. Typically she needs to sit on the side of the bed for a couple minutes before she is able to rise. She also complains of an associated tension-like feeling that radiates between her shoulders and posterior head. Sometimes before getting up she may read for a time while lying in bed. Her symptoms normally wane and resolve by late morning.   She believes her lightheadedness episodes may be related to having an especially stressful week. Of note, she is still working and commutes to [employer] daily.  In clinic today  her blood pressure was elevated at 162/82. Improved to 138/76 on manual recheck.  Lately she has noticed that her psoriasis is worsening. She continues to treat with prescribed medications and follow up with her dermatologist.  Last weekend she was working in the attic with no symptomatic issues.  She denies any palpitations, chest pain, shortness of breath, or peripheral edema. No headaches, syncope, orthopnea, or PND.   Past Medical History:  Diagnosis Date   Anxiety    Arthritis    Depression    on Prozac for 15 years   Hemorrhoids    Hyperlipidemia    intolerant to statins   Hypertension    IBS (irritable colon syndrome)    Menopause    PONV (postoperative nausea and vomiting)     Past Surgical History:  Procedure Laterality Date   BREAST ENHANCEMENT SURGERY     CESAREAN SECTION     COLONOSCOPY     REPLACEMENT TOTAL KNEE     left knee   TONSILLECTOMY      Current Medications: Current Meds  Medication Sig   amLODipine (NORVASC) 5 MG tablet Take 1 tablet (5 mg total) by mouth daily.   Cholecalciferol (VITAMIN D3) 125 MCG (5000 UT) CAPS Take by mouth.   clonazePAM (KLONOPIN) 1 MG tablet Take 1 mg by mouth as needed.   FLUoxetine (PROZAC) 40 MG capsule Take 40 mg by mouth daily.   ibuprofen (ADVIL) 200 MG tablet Take 400 mg by mouth every 6 (six) hours as needed.     Allergies:  Sulfa antibiotics   Social History   Tobacco Use   Smoking status: Never   Smokeless tobacco: Never  Substance Use Topics   Alcohol use: No   Drug use: No    Family History: The patient's family history includes Cancer in her mother; Colon cancer in her mother; Heart disease in her father and mother. Father died at age 65 for heart disease. Brother has had MIx2. Mother had cardiomyopathy and pacemaker, passed away from cancer at age 56.  ROS:   Please see the history of present illness.   (+) Lightheadedness (+) Tension with radiation between bilateral shoulders and posterior  head (+) Psoriasis Additional pertinent ROS otherwise unremarkable.  EKGs/Labs/Other Studies Reviewed:    The following studies were reviewed today:  CT-A Chest 05/25/2019: FINDINGS: Cardiovascular: No filling defects in the pulmonary arteries to suggest pulmonary emboli. Heart is borderline in size. Small pericardial effusion. Aorta is normal caliber. Scattered aortic calcifications.   Mediastinum/Nodes: No mediastinal, hilar, or axillary adenopathy. Trachea and esophagus are unremarkable. Large heterogeneous solid mass involving much of the left thyroid lobe measuring up to 6 cm.   Lungs/Pleura: No confluent opacities or pleural effusions.   Upper Abdomen: Imaging into the upper abdomen shows no acute findings. 1.7 cm gallstone in the gallbladder neck.   Musculoskeletal: Bilateral calcified breast implants. No acute bony abnormality.   Review of the MIP images confirms the above findings.   IMPRESSION: No evidence of pulmonary embolus.   Borderline heart size.  Small pericardial effusion.   Cholelithiasis.   No acute cardiopulmonary disease.   Aortic Atherosclerosis (ICD10-I70.0).  EKG:  EKG is personally reviewed today.   01/29/2022:  EKG was not ordered. 06/22/2021: sinus tachycardia at 104 bpm with rare PAC 09/02/20: normal sinus rhythm at 86 bpm  Recent Labs: No results found for requested labs within last 365 days.   Recent Lipid Panel    Component Value Date/Time   CHOL 192 09/02/2020 1024   TRIG 123 09/02/2020 1024   HDL 49 09/02/2020 1024   CHOLHDL 3.9 09/02/2020 1024   CHOLHDL 4.3 01/21/2011 1115   VLDL 18 01/21/2011 1115   LDLCALC 121 (H) 09/02/2020 1024    Physical Exam:    VS:  BP 138/76 (BP Location: Right Arm, Patient Position: Sitting, Cuff Size: Large)   Pulse (!) 102   Ht 5' 5"  (1.651 m)   Wt 249 lb (112.9 kg)   BMI 41.44 kg/m     Wt Readings from Last 3 Encounters:  01/29/22 249 lb (112.9 kg)  06/22/21 245 lb 3.2 oz (111.2 kg)   11/06/20 247 lb (112 kg)    GEN: Well nourished, well developed in no acute distress HEENT: Normal, moist mucous membranes NECK: No JVD CARDIAC: regular rhythm, rare dropped beat,normal S1 and S2, no rubs or gallops. 1/6 systolic murmur. VASCULAR: Radial and DP pulses 2+ bilaterally. No carotid bruits RESPIRATORY:  Clear to auscultation without rales, wheezing or rhonchi  ABDOMEN: Soft, non-tender, non-distended MUSCULOSKELETAL:  Ambulates independently SKIN: Warm and dry, no edema. Bilateral LE psoriasis. NEUROLOGIC:  Alert and oriented x 3. No focal neuro deficits noted. PSYCHIATRIC:  Normal affect   ASSESSMENT:    1. Essential hypertension   2. Pure hypercholesterolemia   3. Family history of heart disease   4. Medication management   5. Class 3 severe obesity due to excess calories without serious comorbidity with body mass index (BMI) of 40.0 to 44.9 in adult [facility])    PLAN:  Hypertension:  -continue amlodipine 5 mg daily -slightly above goal, discussed home monitoring again today  Palpitations -palpitations resolved -labs, ECG unremarkable.  Hypercholesterolemia -had muscle aches on simvastatin -discussed again today. She is willing to try low dose rosuvastatin 3 times/week. She will contact me if she does not tolerate this. Recheck lipids and LFTs in 3 mos  BMI ~41, class 3 severe obesity: -we have discussed GLP1RA for weight loss in the past, unfortunately not currently covered by Medicare -limited activity due to knee arthritis with prior surgeries  Family history of heart disease: brother has ICD, follows with [facility], near 38 but has significant history of heart disease.  Primary prevention of cardiovascular disease -recommend heart healthy/Mediterranean diet, with whole grains, fruits, vegetable, fish, lean meats, nuts, and olive oil. Limit salt. -recommend moderate walking, 3-5 times/week for 30-50 minutes each session. Aim for at least 150 minutes.week. Goal  should be pace of 3 miles/hours, or walking 1.5 miles in 30 minutes -recommend avoidance of tobacco products. Avoid excess alcohol. -ASCVD risk score: The 10-year ASCVD risk score (Arnett DK, et al., 2019) is: 21.7%   Values used to calculate the score:     Age: 89 years     Sex: Female     Is Non-Hispanic African American: No     Diabetic: No     Tobacco smoker: No     Systolic Blood Pressure: 169 mmHg     Is BP treated: Yes     HDL Cholesterol: 49 mg/dL     Total Cholesterol: 192 mg/dL   Follow up: 6 months or sooner as needed.  Medication Adjustments/Labs and Tests Ordered: Current medicines are reviewed at length with the patient today.  Concerns regarding medicines are outlined above.   Orders Placed This Encounter  Procedures   Lipid panel   Hepatic function panel   Meds ordered this encounter  Medications   rosuvastatin (CRESTOR) 5 MG tablet    Sig: Take 1 tablet (5 mg total) by mouth 3 (three) times a week.    Dispense:  30 tablet    Refill:  4   Patient Instructions  Medication Instructions:  START: Rosuvastatin (Crestor)  5 mg three times a week  *If you need a refill on your cardiac medications before your next appointment, please call your pharmacy*   Lab Work: Your provider has recommended lab work 1 week prior to follow up appointment (Lipid, LFT). Please have this collected at [facility] [place]. The lab is open 8:00 am - 4:30 pm. Please avoid 12:00p - 1:00p for lunch hour. You do not need an appointment. Please go to [address] [place], [place] [address]. This is in the Primary Care office on the 3rd floor, let them know you are there for blood work and they will direct you to the lab.  If you have labs (blood work) drawn today and your tests are completely normal, you will receive your results only by: [place] (if you have MyChart) OR A paper copy in the mail If you have any lab test that is abnormal or we need to  change your treatment, we will call you to review the results.   Testing/Procedures: None ordered today   Follow-Up: At [facility], you and your health needs are our priority.  As part of our continuing mission to provide you with exceptional heart care, we have created designated Provider Care Teams.  These Care Teams include your primary Cardiologist (physician) and Advanced  Practice Providers (APPs -  Physician Assistants and Nurse Practitioners) who all work together to provide you with the care you need, when you need it.  We recommend signing up for the patient portal called "MyChart".  Sign up information is provided on this After Visit Summary.  MyChart is used to connect with patients for Virtual Visits (Telemedicine).  Patients are able to view lab/test results, encounter notes, upcoming appointments, etc.  Non-urgent messages can be sent to your provider as well.   To learn more about what you can do with MyChart, go to [email or web address].    Your next appointment:   6 month(s)  The format for your next appointment:   In Person  Provider:   Buford Dresser, MD{  Recent data, summarized from [facility] from a study from the [facility]:  https://wilkins.info/  "Researchers at the [facility] set out to answer this question by comparing statins to supplements in a clinical trial. They tracked the outcomes of 190 adults, ages 35 to 68. Some participants were given a 5 mg daily dose of rosuvastatin, a statin that is sold under the brand name Crestor for 28 days. Others were given supplements, including fish oil, cinnamon, garlic, turmeric, plant sterols or red yeast rice for the same period."  "What we found was that rosuvastatin lowered LDL cholesterol by almost 38% and that was vastly superior to placebo and any of the six supplements  studied in the trial," study Despina Hidden, M.D. of the [facility] [place] told NPR. He says this level of reduction is enough to lower the risk of heart attacks and strokes. The findings are published in the Journal of the [employer] of Cardiology.  "Oftentimes these supplements are marketed as 'natural ways' to lower your cholesterol," says Laffin. But he says none of the dietary supplements demonstrated any significant decrease in LDL cholesterol compared with a placebo. LDL cholesterol is considered the 'bad cholesterol' because it can contribute to plaque build-up in the artery walls - which can narrow the arteries, and set the stage for heart attacks and strokes"    I,Mathew Stumpf,acting as a scribe for [employer], MD.,have documented all relevant documentation on the behalf of Buford Dresser, MD,as directed by  Buford Dresser, MD while in the presence of Buford Dresser, MD.  I, Buford Dresser, MD, have reviewed all documentation for this visit. The documentation on 01/29/22 for the exam, diagnosis, procedures, and orders are all accurate and complete.   Signed, Buford Dresser, MD PhD, [facility] 01/29/2022     [place]

## 2022-01-29 ENCOUNTER — Ambulatory Visit (HOSPITAL_BASED_OUTPATIENT_CLINIC_OR_DEPARTMENT_OTHER): Payer: Medicare Other | Admitting: Cardiology

## 2022-01-29 ENCOUNTER — Encounter (HOSPITAL_BASED_OUTPATIENT_CLINIC_OR_DEPARTMENT_OTHER): Payer: Self-pay | Admitting: Cardiology

## 2022-01-29 VITALS — BP 138/76 | HR 102 | Ht 65.0 in | Wt 249.0 lb

## 2022-01-29 DIAGNOSIS — Z8249 Family history of ischemic heart disease and other diseases of the circulatory system: Secondary | ICD-10-CM

## 2022-01-29 DIAGNOSIS — I10 Essential (primary) hypertension: Secondary | ICD-10-CM

## 2022-01-29 DIAGNOSIS — E7800 Pure hypercholesterolemia, unspecified: Secondary | ICD-10-CM

## 2022-01-29 DIAGNOSIS — Z6841 Body Mass Index (BMI) 40.0 and over, adult: Secondary | ICD-10-CM

## 2022-01-29 DIAGNOSIS — Z79899 Other long term (current) drug therapy: Secondary | ICD-10-CM

## 2022-01-29 MED ORDER — ROSUVASTATIN CALCIUM 5 MG PO TABS: 5.0000 mg | ORAL_TABLET | ORAL | 4 refills | Status: DC

## 2022-01-29 NOTE — Patient Instructions (Addendum)
Medication Instructions:  START: Rosuvastatin (Crestor)  5 mg three times a week  *If you need a refill on your cardiac medications before your next appointment, please call your pharmacy*   Lab Work: Your provider has recommended lab work 1 week prior to follow up appointment (Lipid, LFT). Please have this collected at [facility] at [place]. The lab is open 8:00 am - 4:30 pm. Please avoid 12:00p - 1:00p for lunch hour. You do not need an appointment. Please go to [address] [place], [place] [address]. This is in the Primary Care office on the 3rd floor, let them know you are there for blood work and they will direct you to the lab.  If you have labs (blood work) drawn today and your tests are completely normal, you will receive your results only by: [place] (if you have MyChart) OR A paper copy in the mail If you have any lab test that is abnormal or we need to change your treatment, we will call you to review the results.   Testing/Procedures: None ordered today   Follow-Up: At [facility], you and your health needs are our priority.  As part of our continuing mission to provide you with exceptional heart care, we have created designated Provider Care Teams.  These Care Teams include your primary Cardiologist (physician) and Advanced Practice Providers (APPs -  Physician Assistants and Nurse Practitioners) who all work together to provide you with the care you need, when you need it.  We recommend signing up for the patient portal called "MyChart".  Sign up information is provided on this After Visit Summary.  MyChart is used to connect with patients for Virtual Visits (Telemedicine).  Patients are able to view lab/test results, encounter notes, upcoming appointments, etc.  Non-urgent messages can be sent to your provider as well.   To learn more about what you can do with MyChart, go to [email or web address].    Your next appointment:   6  month(s)  The format for your next appointment:   In Person  Provider:   Buford Dresser, MD{  Recent data, summarized from [facility] from a study from the [facility]:  https://wilkins.info/  "Researchers at the [facility] set out to answer this question by comparing statins to supplements in a clinical trial. They tracked the outcomes of 190 adults, ages 35 to 16. Some participants were given a 5 mg daily dose of rosuvastatin, a statin that is sold under the brand name Crestor for 28 days. Others were given supplements, including fish oil, cinnamon, garlic, turmeric, plant sterols or red yeast rice for the same period."  "What we found was that rosuvastatin lowered LDL cholesterol by almost 38% and that was vastly superior to placebo and any of the six supplements studied in the trial," study Despina Hidden, M.D. of the [facility] [place] told NPR. He says this level of reduction is enough to lower the risk of heart attacks and strokes. The findings are published in the Journal of the [employer] of Cardiology.  "Oftentimes these supplements are marketed as 'natural ways' to lower your cholesterol," says Laffin. But he says none of the dietary supplements demonstrated any significant decrease in LDL cholesterol compared with a placebo. LDL cholesterol is considered the 'bad cholesterol' because it can contribute to plaque build-up in the artery walls - which can narrow the arteries, and set the stage for heart attacks and strokes"

## 2022-02-28 ENCOUNTER — Encounter (HOSPITAL_BASED_OUTPATIENT_CLINIC_OR_DEPARTMENT_OTHER): Payer: Self-pay | Admitting: Cardiology

## 2022-06-16 DIAGNOSIS — R051 Acute cough: Secondary | ICD-10-CM | POA: Diagnosis not present

## 2022-07-12 DIAGNOSIS — M542 Cervicalgia: Secondary | ICD-10-CM | POA: Diagnosis not present

## 2022-07-12 DIAGNOSIS — M9901 Segmental and somatic dysfunction of cervical region: Secondary | ICD-10-CM | POA: Diagnosis not present

## 2022-07-12 DIAGNOSIS — M9902 Segmental and somatic dysfunction of thoracic region: Secondary | ICD-10-CM | POA: Diagnosis not present

## 2022-07-12 DIAGNOSIS — M47812 Spondylosis without myelopathy or radiculopathy, cervical region: Secondary | ICD-10-CM | POA: Diagnosis not present

## 2022-07-12 DIAGNOSIS — M546 Pain in thoracic spine: Secondary | ICD-10-CM | POA: Diagnosis not present

## 2022-07-14 DIAGNOSIS — M542 Cervicalgia: Secondary | ICD-10-CM | POA: Diagnosis not present

## 2022-07-14 DIAGNOSIS — M9902 Segmental and somatic dysfunction of thoracic region: Secondary | ICD-10-CM | POA: Diagnosis not present

## 2022-07-14 DIAGNOSIS — M546 Pain in thoracic spine: Secondary | ICD-10-CM | POA: Diagnosis not present

## 2022-07-14 DIAGNOSIS — M9901 Segmental and somatic dysfunction of cervical region: Secondary | ICD-10-CM | POA: Diagnosis not present

## 2022-07-19 DIAGNOSIS — M546 Pain in thoracic spine: Secondary | ICD-10-CM | POA: Diagnosis not present

## 2022-07-19 DIAGNOSIS — M9901 Segmental and somatic dysfunction of cervical region: Secondary | ICD-10-CM | POA: Diagnosis not present

## 2022-07-19 DIAGNOSIS — M9902 Segmental and somatic dysfunction of thoracic region: Secondary | ICD-10-CM | POA: Diagnosis not present

## 2022-07-19 DIAGNOSIS — M542 Cervicalgia: Secondary | ICD-10-CM | POA: Diagnosis not present

## 2022-07-21 DIAGNOSIS — M542 Cervicalgia: Secondary | ICD-10-CM | POA: Diagnosis not present

## 2022-07-21 DIAGNOSIS — M9901 Segmental and somatic dysfunction of cervical region: Secondary | ICD-10-CM | POA: Diagnosis not present

## 2022-07-21 DIAGNOSIS — M546 Pain in thoracic spine: Secondary | ICD-10-CM | POA: Diagnosis not present

## 2022-07-21 DIAGNOSIS — M9902 Segmental and somatic dysfunction of thoracic region: Secondary | ICD-10-CM | POA: Diagnosis not present

## 2022-07-26 DIAGNOSIS — M9902 Segmental and somatic dysfunction of thoracic region: Secondary | ICD-10-CM | POA: Diagnosis not present

## 2022-07-26 DIAGNOSIS — M546 Pain in thoracic spine: Secondary | ICD-10-CM | POA: Diagnosis not present

## 2022-07-26 DIAGNOSIS — M542 Cervicalgia: Secondary | ICD-10-CM | POA: Diagnosis not present

## 2022-07-26 DIAGNOSIS — M9901 Segmental and somatic dysfunction of cervical region: Secondary | ICD-10-CM | POA: Diagnosis not present

## 2022-07-27 DIAGNOSIS — M9902 Segmental and somatic dysfunction of thoracic region: Secondary | ICD-10-CM | POA: Diagnosis not present

## 2022-07-27 DIAGNOSIS — M542 Cervicalgia: Secondary | ICD-10-CM | POA: Diagnosis not present

## 2022-07-27 DIAGNOSIS — M546 Pain in thoracic spine: Secondary | ICD-10-CM | POA: Diagnosis not present

## 2022-07-27 DIAGNOSIS — M9901 Segmental and somatic dysfunction of cervical region: Secondary | ICD-10-CM | POA: Diagnosis not present

## 2022-07-28 ENCOUNTER — Ambulatory Visit (HOSPITAL_BASED_OUTPATIENT_CLINIC_OR_DEPARTMENT_OTHER): Payer: Medicare Other | Admitting: Cardiology

## 2022-07-28 ENCOUNTER — Encounter (HOSPITAL_BASED_OUTPATIENT_CLINIC_OR_DEPARTMENT_OTHER): Payer: Self-pay | Admitting: Cardiology

## 2022-07-28 VITALS — BP 134/74 | HR 113 | Ht 65.0 in | Wt 247.8 lb

## 2022-07-28 DIAGNOSIS — I10 Essential (primary) hypertension: Secondary | ICD-10-CM | POA: Diagnosis not present

## 2022-07-28 DIAGNOSIS — R000 Tachycardia, unspecified: Secondary | ICD-10-CM

## 2022-07-28 DIAGNOSIS — E7800 Pure hypercholesterolemia, unspecified: Secondary | ICD-10-CM

## 2022-07-28 DIAGNOSIS — Z6841 Body Mass Index (BMI) 40.0 and over, adult: Secondary | ICD-10-CM

## 2022-07-28 DIAGNOSIS — Z8249 Family history of ischemic heart disease and other diseases of the circulatory system: Secondary | ICD-10-CM

## 2022-07-28 MED ORDER — ROSUVASTATIN CALCIUM 5 MG PO TABS: 5.0000 mg | ORAL_TABLET | Freq: Every day | ORAL | 3 refills | Status: DC

## 2022-07-28 NOTE — Progress Notes (Signed)
Cardiology Office Note:    Date:  07/28/2022   ID:  Brenda, Reeves February 08, 1946, MRN [id number]  PCP:  [facility]  Cardiologist:  Buford Dresser, MD PhD  Referring MD: [facility]   CC: follow up  History of Present Illness:    Brenda Reeves is a 77 y.o. female with a hx of palpitations, hypertension, obesity, seen for follow up today. I first met her 12/2017 as a new consult at the request of Dr. Coralyn Mark for evaluation and management of cardiac risk factors.  CV risk: Never smoker. Tried on simvastatin (she believes) many years ago, had muscle pain. Father died at age 62 for heart disease. Brother has had MIx2. Mother had cardiomyopathy and pacemaker, passed away from cancer at age 37.  Today, she is overall well.  She complains of being tachycardic and having a heart "flutter" during a recent cold. She had this URI in December and January where she had a persistent cough. She notes that she has been feeling better since her cough has resolved.   Her initial blood pressure was slightly elevated today, then improved on recheck.  She has not been monitoring her blood pressure at home but she recently bought a machine.   She has been compliant with Rosuvastatin, 5 mg three times daily. She has had no issues with this and is amenable to increasing to daily dosing.  She reports having headaches and tingling in her hands that resolved after seeing a chiropractor. She would feel the headache in there neck and back of her head. She had scans done that showed arthritis.   She continues to work 4 days a week. She commutes to [employer]. She is interested in cutting back her hours to 3 days a week. She believes work is contributing to her stress. She is interested in staying active but is usually unable to do activities due to work taking up most of her day.   She denies any palpitations, chest pain, shortness of breath, or peripheral  edema. No lightheadedness, syncope, orthopnea, or PND.  Past Medical History:  Diagnosis Date   Anxiety    Arthritis    Depression    on Prozac for 15 years   Hemorrhoids    Hyperlipidemia    intolerant to statins   Hypertension    IBS (irritable colon syndrome)    Menopause    PONV (postoperative nausea and vomiting)     Past Surgical History:  Procedure Laterality Date   BREAST ENHANCEMENT SURGERY     CESAREAN SECTION     COLONOSCOPY     REPLACEMENT TOTAL KNEE     left knee   TONSILLECTOMY      Current Medications: Current Meds  Medication Sig   amLODipine (NORVASC) 5 MG tablet Take 1 tablet (5 mg total) by mouth daily.   Cholecalciferol (VITAMIN D3) 125 MCG (5000 UT) CAPS Take by mouth.   FLUoxetine (PROZAC) 40 MG capsule Take 40 mg by mouth daily.   ibuprofen (ADVIL) 200 MG tablet Take 400 mg by mouth every 6 (six) hours as needed.   [DISCONTINUED] rosuvastatin (CRESTOR) 5 MG tablet Take 1 tablet (5 mg total) by mouth 3 (three) times a week.     Allergies:   Sulfa antibiotics   Social History   Tobacco Use   Smoking status: Never   Smokeless tobacco: Never  Substance Use Topics   Alcohol use: No   Drug use: No  Family History: The patient's family history includes Cancer in her mother; Colon cancer in her mother; Heart disease in her father and mother. Father died at age 75 for heart disease. Brother has had MIx2. Mother had cardiomyopathy and pacemaker, passed away from cancer at age 60.  ROS:   Please see the history of present illness.   (+) Tachycardia  (+) Heart "flutter" (+) Headaches (+) Tingling in hands  (+) Stress - work related Additional pertinent ROS otherwise unremarkable.  EKGs/Labs/Other Studies Reviewed:    The following studies were reviewed today:  CT-A Chest 05/25/2019: FINDINGS: Cardiovascular: No filling defects in the pulmonary arteries to suggest pulmonary emboli. Heart is borderline in size. Small pericardial  effusion. Aorta is normal caliber. Scattered aortic calcifications.   Mediastinum/Nodes: No mediastinal, hilar, or axillary adenopathy. Trachea and esophagus are unremarkable. Large heterogeneous solid mass involving much of the left thyroid lobe measuring up to 6 cm.   Lungs/Pleura: No confluent opacities or pleural effusions.   Upper Abdomen: Imaging into the upper abdomen shows no acute findings. 1.7 cm gallstone in the gallbladder neck.   Musculoskeletal: Bilateral calcified breast implants. No acute bony abnormality.   Review of the MIP images confirms the above findings.   IMPRESSION: No evidence of pulmonary embolus.   Borderline heart size.  Small pericardial effusion.   Cholelithiasis.   No acute cardiopulmonary disease.   Aortic Atherosclerosis (ICD10-I70.0).  EKG:  EKG is personally reviewed today.   07/28/2022: Sinus tachycardia. Rate 13 bpm. 01/29/2022:  EKG was not ordered. 06/22/2021: sinus tachycardia at 104 bpm with rare PAC 09/02/20: normal sinus rhythm at 86 bpm  Recent Labs: No results found for requested labs within last 365 days.   Recent Lipid Panel    Component Value Date/Time   CHOL 192 09/02/2020 1024   TRIG 123 09/02/2020 1024   HDL 49 09/02/2020 1024   CHOLHDL 3.9 09/02/2020 1024   CHOLHDL 4.3 01/21/2011 1115   VLDL 18 01/21/2011 1115   LDLCALC 121 (H) 09/02/2020 1024    Physical Exam:    VS:  BP 134/74 (BP Location: Right Arm, Patient Position: Sitting)   Pulse (!) 113   Ht 5' 5"$  (1.651 m)   Wt 247 lb 12.8 oz (112.4 kg)   BMI 41.24 kg/m     Wt Readings from Last 3 Encounters:  07/28/22 247 lb 12.8 oz (112.4 kg)  01/29/22 249 lb (112.9 kg)  06/22/21 245 lb 3.2 oz (111.2 kg)    GEN: Well nourished, well developed in no acute distress HEENT: Normal, moist mucous membranes NECK: No JVD CARDIAC: regular rhythm, normal S1 and S2, no rubs or gallops.  1/6 systolic murmur. VASCULAR: Radial and DP pulses 2+ bilaterally. No carotid  bruits RESPIRATORY:  Clear to auscultation without rales, wheezing or rhonchi  ABDOMEN: Soft, non-tender, non-distended MUSCULOSKELETAL:  Ambulates independently SKIN: Warm and dry, no edema. Bilateral LE psoriasis. NEUROLOGIC:  Alert and oriented x 3. No focal neuro deficits noted. PSYCHIATRIC:  Normal affect   ASSESSMENT:    1. Sinus tachycardia   2. Essential hypertension   3. Pure hypercholesterolemia   4. Family history of heart disease   5. Class 3 severe obesity due to excess calories without serious comorbidity with body mass index (BMI) of 40.0 to 44.9 in adult [facility])     PLAN:    Hypertension:  -continue amlodipine 5 mg daily -slightly above goal, gave instructions on home monitoring. She will send me readings via mychart in a few weeks  Palpitations Sinus tachycardia -palpitations resolved -baseline is sinus tachycardia, this has been chronic -labs, ECG unremarkable.  Hypercholesterolemia -had muscle aches on simvastatin -tolerating rosuvastatin, willing to increase from 3x/week to daily.  BMI ~41, class 3 severe obesity: -we have discussed GLP1RA for weight loss in the past, unfortunately not currently covered by Medicare -limited activity due to knee arthritis with prior surgeries  Family history of heart disease: brother has ICD, follows with [facility], near 17 but has significant history of heart disease.  Primary prevention of cardiovascular disease -recommend heart healthy/Mediterranean diet, with whole grains, fruits, vegetable, fish, lean meats, nuts, and olive oil. Limit salt. -recommend moderate walking, 3-5 times/week for 30-50 minutes each session. Aim for at least 150 minutes.week. Goal should be pace of 3 miles/hours, or walking 1.5 miles in 30 minutes -recommend avoidance of tobacco products. Avoid excess alcohol. -ASCVD risk score: The 10-year ASCVD risk score (Arnett DK, et al., 2019) is: 22.7%   Values used to calculate the score:     Age: 59  years     Sex: Female     Is Non-Hispanic African American: No     Diabetic: No     Tobacco smoker: No     Systolic Blood Pressure: [id number] mmHg     Is BP treated: Yes     HDL Cholesterol: 49 mg/dL     Total Cholesterol: 192 mg/dL   Plan for follow up: 6 months or sooner as needed.  Medication Adjustments/Labs and Tests Ordered: Current medicines are reviewed at length with the patient today.  Concerns regarding medicines are outlined above.   Orders Placed This Encounter  Procedures   EKG 12-Lead   Meds ordered this encounter  Medications   rosuvastatin (CRESTOR) 5 MG tablet    Sig: Take 1 tablet (5 mg total) by mouth daily.    Dispense:  90 tablet    Refill:  3   Patient Instructions  Medication Instructions:  Your physician recommends that you continue on your current medications as directed. Please refer to the Current Medication list given to you today.  *If you need a refill on your cardiac medications before your next appointment, please call your pharmacy*  Follow-Up: At [facility], you and your health needs are our priority.  As part of our continuing mission to provide you with exceptional heart care, we have created designated Provider Care Teams.  These Care Teams include your primary Cardiologist (physician) and Advanced Practice Providers (APPs -  Physician Assistants and Nurse Practitioners) who all work together to provide you with the care you need, when you need it.  We recommend signing up for the patient portal called "MyChart".  Sign up information is provided on this After Visit Summary.  MyChart is used to connect with patients for Virtual Visits (Telemedicine).  Patients are able to view lab/test results, encounter notes, upcoming appointments, etc.  Non-urgent messages can be sent to your provider as well.   To learn more about what you can do with MyChart, go to [email or web address].    Your next appointment:   6 month(s)  Provider:    Buford Dresser, MD    Other Instructions -how to check blood pressure:  -sit comfortably in a chair, feet uncrossed and flat on floor, for 5-10 minutes  -arm ideally should rest at the level of the heart. However, arm should be relaxed and not tense (for example, do not hold the arm up unsupported)  -avoid exercise, caffeine, and tobacco for at  least 30 minutes prior to BP reading  -don't take BP cuff reading over clothes (always place on skin directly)  -I prefer to know how well the medication is working, so I would like you to take your readings 1-2 hours after taking your blood pressure medication if possible   Your goal <130/80 on average. Send me some readings in a few weeks and we will make adjustments if needed.  Try increasing statin to daily, call with any issues.    I,Rachel Rivera,acting as a Education administrator for [employer], MD.,have documented all relevant documentation on the behalf of Buford Dresser, MD,as directed by  Buford Dresser, MD while in the presence of Buford Dresser, MD.  I, Buford Dresser, MD, have reviewed all documentation for this visit. The documentation on 07/28/22 for the exam, diagnosis, procedures, and orders are all accurate and complete.   Signed, Buford Dresser, MD PhD, [facility] 07/28/2022     [place]

## 2022-07-28 NOTE — Patient Instructions (Addendum)
Medication Instructions:  Your physician recommends that you continue on your current medications as directed. Please refer to the Current Medication list given to you today.  *If you need a refill on your cardiac medications before your next appointment, please call your pharmacy*  Follow-Up: At [facility], you and your health needs are our priority.  As part of our continuing mission to provide you with exceptional heart care, we have created designated Provider Care Teams.  These Care Teams include your primary Cardiologist (physician) and Advanced Practice Providers (APPs -  Physician Assistants and Nurse Practitioners) who all work together to provide you with the care you need, when you need it.  We recommend signing up for the patient portal called "MyChart".  Sign up information is provided on this After Visit Summary.  MyChart is used to connect with patients for Virtual Visits (Telemedicine).  Patients are able to view lab/test results, encounter notes, upcoming appointments, etc.  Non-urgent messages can be sent to your provider as well.   To learn more about what you can do with MyChart, go to [email or web address].    Your next appointment:   6 month(s)  Provider:   Buford Dresser, MD    Other Instructions -how to check blood pressure:  -sit comfortably in a chair, feet uncrossed and flat on floor, for 5-10 minutes  -arm ideally should rest at the level of the heart. However, arm should be relaxed and not tense (for example, do not hold the arm up unsupported)  -avoid exercise, caffeine, and tobacco for at least 30 minutes prior to BP reading  -don't take BP cuff reading over clothes (always place on skin directly)  -I prefer to know how well the medication is working, so I would like you to take your readings 1-2 hours after taking your blood pressure medication if possible   Your goal <130/80 on average. Send me some readings in a few weeks and we will make  adjustments if needed.  Try increasing statin to daily, call with any issues.

## 2022-07-29 ENCOUNTER — Ambulatory Visit (HOSPITAL_BASED_OUTPATIENT_CLINIC_OR_DEPARTMENT_OTHER): Payer: Medicare Other | Admitting: Cardiology

## 2022-08-02 ENCOUNTER — Ambulatory Visit (HOSPITAL_BASED_OUTPATIENT_CLINIC_OR_DEPARTMENT_OTHER): Payer: Medicare Other | Admitting: Cardiology

## 2022-08-06 ENCOUNTER — Ambulatory Visit (HOSPITAL_BASED_OUTPATIENT_CLINIC_OR_DEPARTMENT_OTHER): Payer: Medicare Other | Admitting: Cardiology

## 2022-08-09 ENCOUNTER — Ambulatory Visit (HOSPITAL_BASED_OUTPATIENT_CLINIC_OR_DEPARTMENT_OTHER): Payer: Medicare Other | Admitting: Cardiology

## 2022-08-09 DIAGNOSIS — M9902 Segmental and somatic dysfunction of thoracic region: Secondary | ICD-10-CM | POA: Diagnosis not present

## 2022-08-09 DIAGNOSIS — M9901 Segmental and somatic dysfunction of cervical region: Secondary | ICD-10-CM | POA: Diagnosis not present

## 2022-08-09 DIAGNOSIS — M546 Pain in thoracic spine: Secondary | ICD-10-CM | POA: Diagnosis not present

## 2022-08-09 DIAGNOSIS — M542 Cervicalgia: Secondary | ICD-10-CM | POA: Diagnosis not present

## 2022-08-11 DIAGNOSIS — M9901 Segmental and somatic dysfunction of cervical region: Secondary | ICD-10-CM | POA: Diagnosis not present

## 2022-08-11 DIAGNOSIS — M546 Pain in thoracic spine: Secondary | ICD-10-CM | POA: Diagnosis not present

## 2022-08-11 DIAGNOSIS — M9902 Segmental and somatic dysfunction of thoracic region: Secondary | ICD-10-CM | POA: Diagnosis not present

## 2022-08-11 DIAGNOSIS — M542 Cervicalgia: Secondary | ICD-10-CM | POA: Diagnosis not present

## 2022-08-16 DIAGNOSIS — M9902 Segmental and somatic dysfunction of thoracic region: Secondary | ICD-10-CM | POA: Diagnosis not present

## 2022-08-16 DIAGNOSIS — M542 Cervicalgia: Secondary | ICD-10-CM | POA: Diagnosis not present

## 2022-08-16 DIAGNOSIS — M9901 Segmental and somatic dysfunction of cervical region: Secondary | ICD-10-CM | POA: Diagnosis not present

## 2022-08-16 DIAGNOSIS — M546 Pain in thoracic spine: Secondary | ICD-10-CM | POA: Diagnosis not present

## 2022-09-10 ENCOUNTER — Other Ambulatory Visit: Payer: Self-pay | Admitting: Cardiology

## 2022-09-10 DIAGNOSIS — I10 Essential (primary) hypertension: Secondary | ICD-10-CM

## 2022-09-10 NOTE — Telephone Encounter (Signed)
Rx request sent to pharmacy.  

## 2022-09-30 DIAGNOSIS — H2513 Age-related nuclear cataract, bilateral: Secondary | ICD-10-CM | POA: Diagnosis not present

## 2022-09-30 DIAGNOSIS — H5203 Hypermetropia, bilateral: Secondary | ICD-10-CM | POA: Diagnosis not present

## 2022-09-30 DIAGNOSIS — H524 Presbyopia: Secondary | ICD-10-CM | POA: Diagnosis not present

## 2022-10-13 DIAGNOSIS — E785 Hyperlipidemia, unspecified: Secondary | ICD-10-CM | POA: Diagnosis not present

## 2022-10-13 DIAGNOSIS — E559 Vitamin D deficiency, unspecified: Secondary | ICD-10-CM | POA: Diagnosis not present

## 2022-10-13 DIAGNOSIS — I10 Essential (primary) hypertension: Secondary | ICD-10-CM | POA: Diagnosis not present

## 2022-10-13 DIAGNOSIS — Z1159 Encounter for screening for other viral diseases: Secondary | ICD-10-CM | POA: Diagnosis not present

## 2022-10-13 DIAGNOSIS — R739 Hyperglycemia, unspecified: Secondary | ICD-10-CM | POA: Diagnosis not present

## 2022-10-14 DIAGNOSIS — Z9989 Dependence on other enabling machines and devices: Secondary | ICD-10-CM | POA: Diagnosis not present

## 2022-10-14 DIAGNOSIS — I10 Essential (primary) hypertension: Secondary | ICD-10-CM | POA: Diagnosis not present

## 2022-10-14 DIAGNOSIS — Z1211 Encounter for screening for malignant neoplasm of colon: Secondary | ICD-10-CM | POA: Diagnosis not present

## 2022-10-14 DIAGNOSIS — E785 Hyperlipidemia, unspecified: Secondary | ICD-10-CM | POA: Diagnosis not present

## 2022-10-14 DIAGNOSIS — G4700 Insomnia, unspecified: Secondary | ICD-10-CM | POA: Diagnosis not present

## 2022-10-14 DIAGNOSIS — N1831 Chronic kidney disease, stage 3a: Secondary | ICD-10-CM | POA: Diagnosis not present

## 2022-10-14 DIAGNOSIS — Z0000 Encounter for general adult medical examination without abnormal findings: Secondary | ICD-10-CM | POA: Diagnosis not present

## 2022-11-09 DIAGNOSIS — H2511 Age-related nuclear cataract, right eye: Secondary | ICD-10-CM | POA: Diagnosis not present

## 2022-11-09 DIAGNOSIS — H25811 Combined forms of age-related cataract, right eye: Secondary | ICD-10-CM | POA: Diagnosis not present

## 2022-11-09 DIAGNOSIS — H52201 Unspecified astigmatism, right eye: Secondary | ICD-10-CM | POA: Diagnosis not present

## 2022-12-07 DIAGNOSIS — H25812 Combined forms of age-related cataract, left eye: Secondary | ICD-10-CM | POA: Diagnosis not present

## 2022-12-07 DIAGNOSIS — H52202 Unspecified astigmatism, left eye: Secondary | ICD-10-CM | POA: Diagnosis not present

## 2022-12-07 DIAGNOSIS — H2512 Age-related nuclear cataract, left eye: Secondary | ICD-10-CM | POA: Diagnosis not present

## 2023-02-09 ENCOUNTER — Ambulatory Visit (HOSPITAL_BASED_OUTPATIENT_CLINIC_OR_DEPARTMENT_OTHER): Payer: Medicare Other | Admitting: Cardiology

## 2023-02-09 ENCOUNTER — Other Ambulatory Visit (HOSPITAL_BASED_OUTPATIENT_CLINIC_OR_DEPARTMENT_OTHER): Payer: Self-pay

## 2023-02-09 ENCOUNTER — Encounter (HOSPITAL_BASED_OUTPATIENT_CLINIC_OR_DEPARTMENT_OTHER): Payer: Self-pay | Admitting: Cardiology

## 2023-02-09 VITALS — BP 132/84 | HR 100 | Ht 65.0 in | Wt 245.0 lb

## 2023-02-09 DIAGNOSIS — Z6841 Body Mass Index (BMI) 40.0 and over, adult: Secondary | ICD-10-CM

## 2023-02-09 DIAGNOSIS — E7800 Pure hypercholesterolemia, unspecified: Secondary | ICD-10-CM

## 2023-02-09 DIAGNOSIS — Z8249 Family history of ischemic heart disease and other diseases of the circulatory system: Secondary | ICD-10-CM | POA: Diagnosis not present

## 2023-02-09 DIAGNOSIS — Z7189 Other specified counseling: Secondary | ICD-10-CM | POA: Diagnosis not present

## 2023-02-09 DIAGNOSIS — I10 Essential (primary) hypertension: Secondary | ICD-10-CM

## 2023-02-09 MED ORDER — ZEPBOUND 2.5 MG/0.5ML ~~LOC~~ SOAJ
2.5000 mg | SUBCUTANEOUS | 0 refills | Status: DC
  Filled 2023-02-09: qty 2, 28d supply, fill #0

## 2023-02-09 MED ORDER — ZEPBOUND 7.5 MG/0.5ML ~~LOC~~ SOAJ
7.5000 mg | SUBCUTANEOUS | 0 refills | Status: DC
  Filled 2023-02-09: qty 2, 28d supply, fill #0

## 2023-02-09 MED ORDER — ZEPBOUND 5 MG/0.5ML ~~LOC~~ SOAJ
5.0000 mg | SUBCUTANEOUS | 0 refills | Status: DC
  Filled 2023-02-09: qty 2, 28d supply, fill #0

## 2023-02-09 NOTE — Progress Notes (Signed)
Cardiology Office Note:  .    Date:  02/09/2023  ID:  Brenda, Reeves 02/24/1946, MRN [id number] PCP: [facility]  [facility] Providers Cardiologist:  Brenda Red, MD     History of Present Illness: .    Brenda Reeves is a 77 y.o. female with a hx of palpitations, hypertension, obesity, seen for follow up today. I first met her 12/2017 as a new consult at the request of Dr. Constance Reeves for evaluation and management of cardiac risk factors.   CV risk: Never smoker. Tried on simvastatin (she believes) many years ago, had muscle pain. Father died at age 68 for heart disease. Brother has had MIx2. Mother had cardiomyopathy and pacemaker, passed away from cancer at age 83.   At her visit 07/2022, she had some tachycardic episodes with flutters in the setting of a cold; had resolved by the time of our visit. Her blood pressure was slightly above goal. She had obtained a BP cuff and was instructed on home monitoring. She was tolerating rosuvastatin 3 timers per week. We increased her rosuvastatin to daily dosing.  Today, she states she is feeling fine. She has cut back her work hours to 3 days per week.  Her blood pressure is initially elevated to 140/102 in the office. She attributes this to driving from [place]. On manual recheck her BP is 132/84.  She also wishes to discuss dietary recommendations and weight loss, which we reviewed from a cardiovascular perspective. We discussed GLP1RA at length today. She confirms a personal history of IBS. She denies ever having pancreatitis.   She denies any palpitations, chest pain, shortness of breath, peripheral edema, lightheadedness, syncope, orthopnea, or PND.  ROS:  Please see the history of present illness. ROS otherwise negative except as noted.   Studies Reviewed: Marland Kitchen    EKG Interpretation Date/Time:  Wednesday February 09 2023 15:32:42 EDT Ventricular Rate:  100 PR Interval:  170 QRS  Duration:  80 QT Interval:  342 QTC Calculation: 441 R Axis:   68  Text Interpretation: Normal sinus rhythm Normal ECG Confirmed by Brenda Reeves [phone]) on 02/09/2023 4:04:27 PM    Physical Exam:    VS:  BP 132/84 (BP Location: Right Arm, Patient Position: Sitting, Cuff Size: Large)   Pulse 100   Ht 5\' 5"  (1.651 m)   Wt 245 lb (111.1 kg)   SpO2 95%   BMI 40.77 kg/m    Wt Readings from Last 3 Encounters:  02/09/23 245 lb (111.1 kg)  07/28/22 247 lb 12.8 oz (112.4 kg)  01/29/22 249 lb (112.9 kg)    GEN: Well nourished, well developed in no acute distress HEENT: Normal, moist mucous membranes NECK: No JVD CARDIAC: regular rhythm, normal S1 and S2, no rubs or gallops. No murmur. VASCULAR: Radial and DP pulses 2+ bilaterally. No carotid bruits RESPIRATORY:  Clear to auscultation without rales, wheezing or rhonchi  ABDOMEN: Soft, non-tender, non-distended MUSCULOSKELETAL:  Ambulates independently SKIN: Warm and dry, no edema NEUROLOGIC:  Alert and oriented x 3. No focal neuro deficits noted. PSYCHIATRIC:  Normal affect   ASSESSMENT AND PLAN: .    Hypertension:  -continue amlodipine 5 mg daily -slightly above goal, gave instructions on home monitoring   Palpitations Sinus tachycardia -palpitations resolved -baseline is sinus tachycardia, this has been chronic -labs, ECG unremarkable.   Hypercholesterolemia -had muscle aches on simvastatin -tolerating rosuvastatin, willing to increase from 3x/week to daily.   BMI ~41, class 3 severe obesity: -we  have discussed GLP1RA for weight loss in the past, unfortunately not currently covered by Medicare but she would be interested in the future if it becomes covered -limited activity due to knee arthritis with prior surgeries   Family history of heart disease: brother has ICD, follows with [facility], near 62 but has significant history of heart disease.   Primary prevention of cardiovascular disease -recommend heart  healthy/Mediterranean diet, with whole grains, fruits, vegetable, fish, lean meats, nuts, and olive oil. Limit salt. -recommend moderate walking, 3-5 times/week for 30-50 minutes each session. Aim for at least 150 minutes.week. Goal should be pace of 3 miles/hours, or walking 1.5 miles in 30 minutes -recommend avoidance of tobacco products. Avoid excess alcohol.  Dispo: Follow-up in 3 months, or sooner as needed.  I,Brenda Reeves,acting as a Neurosurgeon for [employer], MD.,have documented all relevant documentation on the behalf of Brenda Red, MD,as directed by  Brenda Red, MD while in the presence of Brenda Red, MD.  I, Brenda Red, MD, have reviewed all documentation for this visit. The documentation on 03/21/23 for the exam, diagnosis, procedures, and orders are all accurate and complete.   Signed, Brenda Red, MD

## 2023-02-09 NOTE — Patient Instructions (Addendum)
Medication Instructions:  START ZEPOUND 2.5 MG WEEKLY FOR 4 WEEKS INCREASE TO 5 MG WEEKLY FOR 4 WEEKS  INCREASE TO 7.5 MG WEEKLY FOR 4 WEEKS  *If you need a refill on your cardiac medications before your next appointment, please call your pharmacy*  Lab Work: NONE  Testing/Procedures: NONE  Follow-Up: At [facility], you and your health needs are our priority.  As part of our continuing mission to provide you with exceptional heart care, we have created designated Provider Care Teams.  These Care Teams include your primary Cardiologist (physician) and Advanced Practice Providers (APPs -  Physician Assistants and Nurse Practitioners) who all work together to provide you with the care you need, when you need it.  We recommend signing up for the patient portal called "MyChart".  Sign up information is provided on this After Visit Summary.  MyChart is used to connect with patients for Virtual Visits (Telemedicine).  Patients are able to view lab/test results, encounter notes, upcoming appointments, etc.  Non-urgent messages can be sent to your provider as well.   To learn more about what you can do with MyChart, go to [email or web address].    Your next appointment:   3 month(s)  Provider:   Jodelle Red, MD or Gillian Shields, NP

## 2023-03-06 ENCOUNTER — Other Ambulatory Visit: Payer: Self-pay | Admitting: Cardiology

## 2023-03-06 DIAGNOSIS — I10 Essential (primary) hypertension: Secondary | ICD-10-CM

## 2023-03-25 ENCOUNTER — Telehealth: Payer: Medicare Other | Admitting: Physician Assistant

## 2023-03-25 ENCOUNTER — Telehealth: Payer: Medicare Other

## 2023-03-25 DIAGNOSIS — N3000 Acute cystitis without hematuria: Secondary | ICD-10-CM

## 2023-03-25 MED ORDER — CEPHALEXIN 500 MG PO CAPS: 500.0000 mg | ORAL_CAPSULE | Freq: Two times a day (BID) | ORAL | 0 refills | Status: AC

## 2023-03-25 NOTE — Progress Notes (Signed)

## 2023-05-17 ENCOUNTER — Ambulatory Visit (HOSPITAL_BASED_OUTPATIENT_CLINIC_OR_DEPARTMENT_OTHER): Payer: Medicare Other | Admitting: Family

## 2023-08-11 ENCOUNTER — Ambulatory Visit (HOSPITAL_BASED_OUTPATIENT_CLINIC_OR_DEPARTMENT_OTHER): Payer: Medicare Other | Admitting: Cardiology

## 2023-08-11 ENCOUNTER — Other Ambulatory Visit: Payer: Self-pay

## 2023-08-11 ENCOUNTER — Encounter (HOSPITAL_BASED_OUTPATIENT_CLINIC_OR_DEPARTMENT_OTHER): Payer: Self-pay | Admitting: Cardiology

## 2023-08-11 ENCOUNTER — Other Ambulatory Visit (HOSPITAL_BASED_OUTPATIENT_CLINIC_OR_DEPARTMENT_OTHER): Payer: Self-pay

## 2023-08-11 VITALS — BP 152/68 | HR 105 | Ht 65.0 in | Wt 239.7 lb

## 2023-08-11 DIAGNOSIS — E66812 Obesity, class 2: Secondary | ICD-10-CM

## 2023-08-11 DIAGNOSIS — Z7189 Other specified counseling: Secondary | ICD-10-CM

## 2023-08-11 DIAGNOSIS — Z713 Dietary counseling and surveillance: Secondary | ICD-10-CM

## 2023-08-11 DIAGNOSIS — E7800 Pure hypercholesterolemia, unspecified: Secondary | ICD-10-CM | POA: Diagnosis not present

## 2023-08-11 DIAGNOSIS — Z8249 Family history of ischemic heart disease and other diseases of the circulatory system: Secondary | ICD-10-CM

## 2023-08-11 DIAGNOSIS — I10 Essential (primary) hypertension: Secondary | ICD-10-CM | POA: Diagnosis not present

## 2023-08-11 DIAGNOSIS — Z6839 Body mass index (BMI) 39.0-39.9, adult: Secondary | ICD-10-CM

## 2023-08-11 MED ORDER — AMLODIPINE BESYLATE 10 MG PO TABS: 10.0000 mg | ORAL_TABLET | Freq: Every day | ORAL | 3 refills | Status: DC

## 2023-08-11 MED ORDER — ROSUVASTATIN CALCIUM 5 MG PO TABS: 5.0000 mg | ORAL_TABLET | Freq: Every day | ORAL | 3 refills | Status: AC

## 2023-08-11 NOTE — Patient Instructions (Addendum)
 Medication Instructions:  Your physician has recommended you make the following change in your medication:  START Amlodipine 10 mg daily  *If you need a refill on your cardiac medications before your next appointment, please call your pharmacy*   Follow-Up: At [facility], you and your health needs are our priority.  As part of our continuing mission to provide you with exceptional heart care, we have created designated Provider Care Teams.  These Care Teams include your primary Cardiologist (physician) and Advanced Practice Providers (APPs -  Physician Assistants and Nurse Practitioners) who all work together to provide you with the care you need, when you need it.  We recommend signing up for the patient portal called "MyChart".  Sign up information is provided on this After Visit Summary.  MyChart is used to connect with patients for Virtual Visits (Telemedicine).  Patients are able to view lab/test results, encounter notes, upcoming appointments, etc.  Non-urgent messages can be sent to your provider as well.   To learn more about what you can do with MyChart, go to [email or web address].    Your next appointment:   4 month(s)  July 2025  Provider:   Jodelle Red, MD    Other Instructions Cristal Ford "Eat real food, mostly plants, not too much." Try to eat as unprocessed as possible. You can look at things like Whole30, though this has significant restrictions.  how to check blood pressure:  -sit comfortably in a chair, feet uncrossed and flat on floor, for 5-10 minutes  -arm ideally should rest at the level of the heart. However, arm should be relaxed and not tense (for example, do not hold the arm up unsupported)  -avoid exercise, caffeine, and tobacco for at least 30 minutes prior to BP reading  -don't take BP cuff reading over clothes (always place on skin directly)  Amlodipine can cause some mild end of the day swelling. Compression stockings can help  with this.  Look into [employer] for fitted compression stockings: [address] [place], [place], [place] [address] [phone] [email or web address]

## 2023-08-11 NOTE — Progress Notes (Signed)
 Cardiology Office Note:  .    Date:  08/11/2023  ID:  Carle, Dargan 1945/07/18, MRN [id number] PCP: Jackelyn Poling, DO  [facility] Providers Cardiologist:  Jodelle Red, MD     History of Present Illness: .    Brenda Reeves is a 78 y.o. female with a hx of palpitations, hypertension, obesity, seen for follow up today. I first met her 12/2017 as a new consult at the request of Dr. Constance Goltz for evaluation and management of cardiac risk factors.   CV risk: Never smoker. Tried on simvastatin (she believes) many years ago, had muscle pain. Father died at age 65 for heart disease. Brother has had MIx2. Mother had cardiomyopathy and pacemaker, passed away from cancer at age 42.   Today: She is doing well overall. She understandably could not afford cash pay [employer]  at $650 a month; we discussed some of the newer programs coming out and the hope that these will become more accessible. We discussed multiple diets today, including keto, carnivore, mediterranean, Whole30, Cristal Ford etc. She has many questions about these. She has done keto short term in the past. We discussed the thoughts behind this at length.   Blood pressure is high today, but she is under a great deal of stress at work. Used to have a blood pressure cuff at home but it isn't working. Discussed increasing amlodipine, watching for swelling, and wearing compression stockings.  Tolerating rosuvastatin daily.  Denies chest pain, shortness of breath at rest or with normal exertion. No PND, orthopnea, LE edema or unexpected weight gain. No syncope or palpitations. ROS otherwise negative except as noted.   ROS:  Please see the history of present illness. ROS otherwise negative except as noted.   Studies Reviewed: Marland Kitchen         Physical Exam:    VS:  BP (!) 152/68   Pulse (!) 105   Ht 5\' 5"  (1.651 m)   Wt 239 lb 11.2 oz (108.7 kg)   SpO2 91%   BMI 39.89 kg/m    Wt Readings from Last 3  Encounters:  08/11/23 239 lb 11.2 oz (108.7 kg)  02/09/23 245 lb (111.1 kg)  07/28/22 247 lb 12.8 oz (112.4 kg)    GEN: Well nourished, well developed in no acute distress HEENT: Normal, moist mucous membranes NECK: No JVD CARDIAC: regular rhythm, normal S1 and S2, no rubs or gallops. No murmur. VASCULAR: Radial and DP pulses 2+ bilaterally. No carotid bruits RESPIRATORY:  Clear to auscultation without rales, wheezing or rhonchi  ABDOMEN: Soft, non-tender, non-distended MUSCULOSKELETAL:  Ambulates independently SKIN: Warm and dry, no edema NEUROLOGIC:  Alert and oriented x 3. No focal neuro deficits noted. PSYCHIATRIC:  Normal affect   ASSESSMENT AND PLAN: .    Hypertension:  -elevated today. Increasing amlodipine to 10 mg daily -discussed potential for LE edema. Gave information on compression stockings -she will monitor home BP and contact me if it does not improve   Hypercholesterolemia -had muscle aches on simvastatin -tolerating rosuvastatin 5 mg daily   BMI 39, class 2 severe obesity: -we have discussed GLP1RA for weight loss in the past, unfortunately cost prohibitive right now, but she would be interested in the future if it becomes covered -limited activity due to knee arthritis with prior surgeries -extensive discussion today on supplements, diets, data from both heart health and weight loss perspectives.    Family history of heart disease: brother has ICD, follows with [facility], near 46 but  has significant history of heart disease.   Primary prevention of cardiovascular disease -recommend heart healthy/Mediterranean diet, with whole grains, fruits, vegetable, fish, lean meats, nuts, and olive oil. Limit salt. -recommend moderate walking, 3-5 times/week for 30-50 minutes each session. Aim for at least 150 minutes.week. Goal should be pace of 3 miles/hours, or walking 1.5 miles in 30 minutes -recommend avoidance of tobacco products. Avoid excess alcohol.  Stable  today: Palpitations Sinus tachycardia -palpitations resolved -baseline is sinus tachycardia, this has been chronic -labs, ECG have been unremarkable.  Dispo: Follow-up in July  Total time of encounter: I spent 41 minutes dedicated to the care of this patient on the date of this encounter to include pre-visit review of records, face-to-face time with the patient discussing conditions above, and clinical documentation with the electronic health record. We specifically spent time today discussing diet recommendations at length, reviewed supplements and diet types/data, pros/cons, all questions answered. Also discussed BP goals, increasing amlodipine, side effect management.  Signed, Jodelle Red, MD

## 2023-09-01 ENCOUNTER — Encounter (HOSPITAL_BASED_OUTPATIENT_CLINIC_OR_DEPARTMENT_OTHER): Payer: Self-pay

## 2023-09-01 DIAGNOSIS — I10 Essential (primary) hypertension: Secondary | ICD-10-CM

## 2023-09-02 NOTE — Telephone Encounter (Signed)
 Amlo increased to 10mg  at last office visit, please review and advise

## 2023-09-06 MED ORDER — AMLODIPINE BESYLATE 5 MG PO TABS: 5.0000 mg | ORAL_TABLET | Freq: Every day | ORAL | Status: DC

## 2023-11-25 ENCOUNTER — Encounter

## 2023-11-25 ENCOUNTER — Telehealth: Admitting: Family Medicine

## 2023-11-25 DIAGNOSIS — N3000 Acute cystitis without hematuria: Secondary | ICD-10-CM

## 2023-11-25 MED ORDER — CEPHALEXIN 500 MG PO CAPS: 500.0000 mg | ORAL_CAPSULE | Freq: Two times a day (BID) | ORAL | 0 refills | Status: AC

## 2023-11-25 NOTE — Progress Notes (Signed)

## 2023-12-23 ENCOUNTER — Encounter: Payer: Self-pay | Admitting: Advanced Practice Midwife

## 2024-02-21 ENCOUNTER — Encounter (HOSPITAL_BASED_OUTPATIENT_CLINIC_OR_DEPARTMENT_OTHER): Payer: Self-pay | Admitting: Cardiology

## 2024-02-21 ENCOUNTER — Ambulatory Visit (HOSPITAL_BASED_OUTPATIENT_CLINIC_OR_DEPARTMENT_OTHER): Admitting: Cardiology

## 2024-02-21 VITALS — BP 134/64 | HR 111 | Resp 17 | Ht 65.0 in | Wt 249.0 lb

## 2024-02-21 DIAGNOSIS — I10 Essential (primary) hypertension: Secondary | ICD-10-CM | POA: Diagnosis not present

## 2024-02-21 DIAGNOSIS — R000 Tachycardia, unspecified: Secondary | ICD-10-CM | POA: Diagnosis not present

## 2024-02-21 DIAGNOSIS — E66813 Obesity, class 3: Secondary | ICD-10-CM

## 2024-02-21 DIAGNOSIS — Z8249 Family history of ischemic heart disease and other diseases of the circulatory system: Secondary | ICD-10-CM | POA: Diagnosis not present

## 2024-02-21 DIAGNOSIS — E7800 Pure hypercholesterolemia, unspecified: Secondary | ICD-10-CM

## 2024-02-21 DIAGNOSIS — Z6841 Body Mass Index (BMI) 40.0 and over, adult: Secondary | ICD-10-CM

## 2024-02-21 MED ORDER — AMLODIPINE BESYLATE 10 MG PO TABS: 10.0000 mg | ORAL_TABLET | Freq: Every day | ORAL | 3 refills | Status: DC

## 2024-02-21 NOTE — Progress Notes (Signed)
 Cardiology Office Note:  .    Date:  02/21/2024  ID:  Brenda Reeves, DOB March 16, 1946, MRN [id number] PCP: Dayna Motto, DO  [facility] Providers Cardiologist:  Shelda Bruckner, MD     History of Present Illness: .    Brenda Reeves is a 78 y.o. female with a hx of palpitations, hypertension, obesity, seen for follow up today. I first met her 12/2017 as a new consult at the request of Dr. Brooks for evaluation and management of cardiac risk factors.   CV risk: Never smoker. Tried on simvastatin (she believes) many years ago, had muscle pain. Father died at age 84 for heart disease. Brother has had MIx2. Mother had cardiomyopathy and pacemaker, passed away from cancer at age 72.   We have discussed GLP (unfortunately not covered by her insurance) and variety of lifestyle programs.    Today: Has noticed a lot more swelling since going up to the 10 mg dose amlodipine . Was checking home BP but seemed to jump all over the place. We discussed changing medications, but she feels like the swelling is manageable. She did go to elastic therapy but has had a hard time tolerating compression  stockings but can wear them for about 4 hours at a time. She noted that the swelling was more manageable at the 5 mg dose.  She is planning to cut back hours at her job and thinks she should be able to elevate her feet more often after that.   Discussed GLPs again today.   ROS:  Denies chest pain, shortness of breath at rest or with normal exertion. No PND, orthopnea. No syncope or palpitations. ROS otherwise negative except as noted.   Studies Reviewed: SABRA    EKG Interpretation Date/Time:  Tuesday February 21 2024 14:02:05 EDT Ventricular Rate:  108 PR Interval:  162 QRS Duration:  70 QT Interval:  328 QTC Calculation: 439 R Axis:   103  Text Interpretation: Sinus tachycardia Rightward axis Low voltage QRS Cannot rule out Anterior infarct , age undetermined When compared with  ECG of 09-Feb-2023 15:32, No significant change was found Confirmed by Bruckner Shelda [phone]) on 02/21/2024 2:07:02 PM    Physical Exam:    VS:  BP 134/64 (BP Location: Left Arm, Patient Position: Sitting, Cuff Size: Large)   Pulse (!) 111   Resp 17   Ht 5' 5 (1.651 m)   Wt 249 lb (112.9 kg)   SpO2 96%   BMI 41.44 kg/m    Wt Readings from Last 3 Encounters:  02/21/24 249 lb (112.9 kg)  08/11/23 239 lb 11.2 oz (108.7 kg)  02/09/23 245 lb (111.1 kg)    GEN: Well nourished, well developed in no acute distress HEENT: Normal, moist mucous membranes NECK: No JVD CARDIAC: regular rhythm, normal S1 and S2, no rubs or gallops. No murmur. VASCULAR: Radial and DP pulses 2+ bilaterally. No carotid bruits RESPIRATORY:  Clear to auscultation without rales, wheezing or rhonchi  ABDOMEN: Soft, non-tender, non-distended MUSCULOSKELETAL:  Ambulates independently SKIN: Warm and dry, mild nonpitting bilateral LE edema NEUROLOGIC:  Alert and oriented x 3. No focal neuro deficits noted. PSYCHIATRIC:  Normal affect    ASSESSMENT AND PLAN: .    Hypertension: much improved -continue amlodipine  10 mg daily -she does have LE edema, we discussed that we have alternative medication classes that do not have swelling, but she feels that she can manage the swelling with compression and elevation. She will contact me if this changes -home  cuff has had labile readings, she has stopped monitoring routinely   Hypercholesterolemia -had muscle aches on simvastatin -tolerating rosuvastatin  5 mg daily   BMI 41, class 2 severe obesity: -we have discussed GLP1RA for weight loss in the past, unfortunately cost prohibitive right now, but she would be interested in the future if it becomes covered by her plan -limited activity due to knee arthritis with prior surgeries   Family history of heart disease: brother has ICD, follows with [facility], near 26 but has significant history of heart disease.  Sinus  tachycardia: chronic   Primary prevention of cardiovascular disease -recommend heart healthy/Mediterranean diet, with whole grains, fruits, vegetable, fish, lean meats, nuts, and olive oil. Limit salt. -recommend moderate walking, 3-5 times/week for 30-50 minutes each session. Aim for at least 150 minutes.week. Goal should be pace of 3 miles/hours, or walking 1.5 miles in 30 minutes -recommend avoidance of tobacco products. Avoid excess alcohol.  Dispo: 6 mos per patient preference or sooner as needed  Signed, Shelda Bruckner, MD

## 2024-02-21 NOTE — Patient Instructions (Signed)
 Medication Instructions:  Your physician recommends that you continue on your current medications as directed. Please refer to the Current Medication list given to you today.  *If you need a refill on your cardiac medications before your next appointment, please call your pharmacy*  Lab Work: NONE  Testing/Procedures: NONE  Follow-Up: At [facility], you and your health needs are our priority.  As part of our continuing mission to provide you with exceptional heart care, we have created designated Provider Care Teams.  These Care Teams include your primary Cardiologist (physician) and Advanced Practice Providers (APPs -  Physician Assistants and Nurse Practitioners) who all work together to provide you with the care you need, when you need it.  We recommend signing up for the patient portal called MyChart.  Sign up information is provided on this After Visit Summary.  MyChart is used to connect with patients for Virtual Visits (Telemedicine).  Patients are able to view lab/test results, encounter notes, upcoming appointments, etc.  Non-urgent messages can be sent to your provider as well.   To learn more about what you can do with MyChart, go to [email or web address].    Your next appointment:   6 month(s)  The format for your next appointment:   In Person  Provider:   DR LONNI RECHE ORN NP, OR ROSALINE RAMAN NP

## 2024-03-19 ENCOUNTER — Encounter (HOSPITAL_BASED_OUTPATIENT_CLINIC_OR_DEPARTMENT_OTHER): Payer: Self-pay

## 2024-03-19 DIAGNOSIS — I10 Essential (primary) hypertension: Secondary | ICD-10-CM

## 2024-03-21 DIAGNOSIS — L400 Psoriasis vulgaris: Secondary | ICD-10-CM | POA: Diagnosis not present

## 2024-03-21 MED ORDER — AMLODIPINE BESYLATE 5 MG PO TABS: 5.0000 mg | ORAL_TABLET | Freq: Every day | ORAL | 1 refills | Status: AC

## 2024-08-29 ENCOUNTER — Ambulatory Visit (HOSPITAL_BASED_OUTPATIENT_CLINIC_OR_DEPARTMENT_OTHER): Admitting: Cardiology
